# Patient Record
Sex: Female | Born: 1940 | Race: White | Hispanic: No | Marital: Married | Smoking: Never smoker
Health system: Southern US, Community
[De-identification: ages and names within clinical notes are randomized; demographics above are authoritative.]

## PROBLEM LIST (undated history)

## (undated) DIAGNOSIS — K219 Gastro-esophageal reflux disease without esophagitis: Secondary | ICD-10-CM

## (undated) DIAGNOSIS — E039 Hypothyroidism, unspecified: Secondary | ICD-10-CM

## (undated) DIAGNOSIS — M1990 Unspecified osteoarthritis, unspecified site: Secondary | ICD-10-CM

## (undated) DIAGNOSIS — I10 Essential (primary) hypertension: Secondary | ICD-10-CM

## (undated) HISTORY — PX: OTHER SURGICAL HISTORY: SHX169

## (undated) HISTORY — PX: ABDOMINAL HYSTERECTOMY: SHX81

---

## 2001-12-05 ENCOUNTER — Ambulatory Visit (HOSPITAL_COMMUNITY): Admission: RE | Admit: 2001-12-05 | Discharge: 2001-12-05 | Payer: Self-pay | Admitting: Gastroenterology

## 2001-12-05 ENCOUNTER — Encounter: Payer: Self-pay | Admitting: Gastroenterology

## 2003-06-18 ENCOUNTER — Ambulatory Visit (HOSPITAL_COMMUNITY): Admission: RE | Admit: 2003-06-18 | Discharge: 2003-06-18 | Payer: Self-pay | Admitting: Gastroenterology

## 2004-05-30 ENCOUNTER — Encounter: Admission: RE | Admit: 2004-05-30 | Discharge: 2004-05-30 | Payer: Self-pay | Admitting: Internal Medicine

## 2007-07-03 ENCOUNTER — Encounter: Admission: RE | Admit: 2007-07-03 | Discharge: 2007-07-03 | Payer: Self-pay | Admitting: Otolaryngology

## 2008-05-21 ENCOUNTER — Other Ambulatory Visit: Admission: RE | Admit: 2008-05-21 | Discharge: 2008-05-21 | Payer: Self-pay | Admitting: Family Medicine

## 2008-10-28 ENCOUNTER — Ambulatory Visit: Payer: Self-pay | Admitting: Vascular Surgery

## 2009-01-27 ENCOUNTER — Ambulatory Visit (HOSPITAL_COMMUNITY): Admission: RE | Admit: 2009-01-27 | Discharge: 2009-01-27 | Payer: Self-pay | Admitting: Ophthalmology

## 2009-05-05 ENCOUNTER — Ambulatory Visit (HOSPITAL_COMMUNITY): Admission: RE | Admit: 2009-05-05 | Discharge: 2009-05-05 | Payer: Self-pay | Admitting: Ophthalmology

## 2010-05-23 LAB — CBC
HCT: 42.8 % (ref 36.0–46.0)
Hemoglobin: 14.6 g/dL (ref 12.0–15.0)
MCHC: 34.1 g/dL (ref 30.0–36.0)
MCV: 96.9 fL (ref 78.0–100.0)
Platelets: 253 10*3/uL (ref 150–400)
RBC: 4.41 MIL/uL (ref 3.87–5.11)
RDW: 12.6 % (ref 11.5–15.5)
WBC: 6.9 10*3/uL (ref 4.0–10.5)

## 2010-05-23 LAB — BASIC METABOLIC PANEL
BUN: 13 mg/dL (ref 6–23)
CO2: 31 mEq/L (ref 19–32)
Calcium: 9.5 mg/dL (ref 8.4–10.5)
Chloride: 98 mEq/L (ref 96–112)
Creatinine, Ser: 0.85 mg/dL (ref 0.4–1.2)
GFR calc Af Amer: >60 mL/min (ref >60)
GFR calc non Af Amer: >60 mL/min (ref >60)
Glucose, Bld: 89 mg/dL (ref 70–99)
Potassium: 3.7 mEq/L (ref 3.5–5.1)
Sodium: 139 mEq/L (ref 135–145)

## 2010-05-31 LAB — CBC
HCT: 42.9 % (ref 36.0–46.0)
Hemoglobin: 14.5 g/dL (ref 12.0–15.0)
MCHC: 33.8 g/dL (ref 30.0–36.0)
MCV: 97.5 fL (ref 78.0–100.0)
Platelets: 284 10*3/uL (ref 150–400)
RBC: 4.4 MIL/uL (ref 3.87–5.11)
RDW: 13 % (ref 11.5–15.5)
WBC: 10.3 10*3/uL (ref 4.0–10.5)

## 2010-05-31 LAB — BASIC METABOLIC PANEL
BUN: 17 mg/dL (ref 6–23)
CO2: 33 mEq/L — ABNORMAL HIGH (ref 19–32)
Calcium: 9.7 mg/dL (ref 8.4–10.5)
Chloride: 100 mEq/L (ref 96–112)
Creatinine, Ser: 0.89 mg/dL (ref 0.4–1.2)
GFR calc Af Amer: >60 mL/min (ref >60)
GFR calc non Af Amer: >60 mL/min (ref >60)
Glucose, Bld: 93 mg/dL (ref 70–99)
Potassium: 3.6 mEq/L (ref 3.5–5.1)
Sodium: 141 mEq/L (ref 135–145)

## 2010-07-12 NOTE — Procedures (Signed)
CAROTID DUPLEX EXAM   INDICATION:  Right side loss of vision, possible carotid disease.   HISTORY:  Diabetes:  No  Cardiac:  No  Hypertension:  Yes  Smoking:  No  Previous Surgery:  No  CV History:  The patient states right eye vision loss for the last 6  weeks.  Amaurosis Fugax No, Paresthesias No, Hemiparesis No                                       RIGHT             LEFT  Brachial systolic pressure:         164               154  Brachial Doppler waveforms:         Normal            Normal  Vertebral direction of flow:        Antegrade         Antegrade  DUPLEX VELOCITIES (cm/sec)  CCA peak systolic                   72                78  ECA peak systolic                   85                64  ICA peak systolic                   54                60  ICA end diastolic                   20                17  PLAQUE MORPHOLOGY:                  Heterogeneous     Heterogenous  PLAQUE AMOUNT:                      Minimal           Minimal  PLAQUE LOCATION:                    ICA/ECA           ICA   IMPRESSION:  1. 1% to 39% stenosis of the bilateral internal carotid arteries.  2. A preliminary report was faxed to Dr. Jone Baseman office on October 28, 2008.       ___________________________________________  Larina Earthly, M.D.   CH/MEDQ  D:  10/29/2008  T:  10/29/2008  Job:  [id number]   cc:   Jillyn Hidden A. Rankin, M.D.  Gretta Arab Valentina Lucks, M.D.

## 2010-07-15 NOTE — Op Note (Signed)
NAME:  Cheryl Herman, Cheryl Herman                       ACCOUNT NO.:  [id number]   MEDICAL RECORD NO.:  [id number]                   PATIENT TYPE:  AMB   LOCATION:  ENDO                                 FACILITY:  [facility]   PHYSICIAN:  Danise Edge, M.D.                DATE OF BIRTH:  1940-12-31   DATE OF PROCEDURE:  06/18/2003  DATE OF DISCHARGE:                                 OPERATIVE REPORT   PROCEDURE:  Colonoscopy.   REFERRED BY:  L. Lupe Carney, M.D.   INDICATIONS FOR PROCEDURE:  Cheryl Herman is a 70 year old female born  [birth year].  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  To control her constipation, she takes p.r.n. Metamucil.  She has  generalized bloating with intermittent sharp left sided abdominal pain  unassociated with gastrointestinal bleeding.   On October 30, 2001, Cheryl Herman complete metabolic profile, thyroid  stimulating hormone level, lipid profile were normal.  Her hemoglobin was  10.9 g and her MCV 82.5.   ALLERGIES:  None.   PAST MEDICAL/SURGICAL HISTORY:  Goiter surgery, hysterectomy,  osteoarthritis, hypothyroidism.   FAMILY HISTORY:  Negative for colon cancer.   ENDOSCOPIST:  Danise Edge, M.D.   PREMEDICATION:  Versed 7.5 mg, Demerol 50 mg.   DESCRIPTION OF PROCEDURE:  After obtaining informed consent, Cheryl Herman  was placed in the left lateral  decubitus position. I administered intravenous Demerol and intravenous  Versed to achieve conscious sedation for the procedure. The patient's blood  pressure, oxygen saturation and cardiac rhythm were monitored throughout the  procedure and documented in the medical record.   Anal inspection and digital rectal exam were normal. The Olympus adjustable  pediatric colonoscope was introduced into the rectum and advanced to the  cecum. Colonic preparation for the exam today was excellent.   RECTUM:  Normal.   SIGMOID COLON AND DESCENDING COLON:  Left colonic  diverticulosis.   SPLENIC FLEXURE:  Normal.   TRANSVERSE COLON:  Normal.   HEPATIC FLEXURE:  Normal.   ASCENDING COLON:  Normal.   CECUM AND ILEOCECAL VALVE:  Normal.   ASSESSMENT:  Normal proctocolonoscopy to the cecum.  Diverticulosis of the  left colon is noted.                                               Danise Edge, M.D.    MJ/MEDQ  D:  06/18/2003  T:  06/19/2003  Job:  [id number]   cc:   L. Lupe Carney, M.D.  [address] [place]  [place] [address]  Fax: [phone]

## 2010-07-15 NOTE — Op Note (Signed)
   NAME:  Cheryl Herman, Cheryl Herman NO.:  [id number]   MEDICAL RECORD NO.:  [id number]                   PATIENT TYPE:  AMB   LOCATION:  ENDO                                 FACILITY:  [facility]   PHYSICIAN:  Danise Edge, M.D.                DATE OF BIRTH:  1941-01-23   DATE OF PROCEDURE:  DATE OF DISCHARGE:  12/05/2001                                 OPERATIVE REPORT   PROCEDURE:  Flexible proctosigmoidoscopy report.   PROCEDURE INDICATIONS:  The patient is a 70 year-old female born August 09, 1940.  She is scheduled to undergo a screening colonoscopy to evaluate left  lower quadrant abdominal pain.   ENDOSCOPIST:  Danise Edge, M.D.   PREMEDICATION:  Versed 5 mg and Fentanyl 50 mcg.   PROCEDURE:  After obtaining informed consent the patient was placed in the  left lateral decubitus position.  I administered intravenous Versed and  intravenous Fentanyl to achieve conscious sedation for the procedure.  The  patient's blood pressure, oxygen saturation and cardiac rhythm were  monitored throughout the procedure and documented in the medical record.   Anal inspection was normal.  Digital rectal exam was normal.  The Olympus  pediatric video colonoscope was introduced into the rectum and advanced only  to approximately 35 cm from the anal verge.  Due to colonic loop formation,  I was unable to complete a full colonoscopy.   Endoscopic appearance of the rectum and sigmoid colon with the endoscope  advanced to 35 cm from the anal verge reveals left colonic diverticulosis,  but no endoscopic evidence for the presence of colorectal neoplasia.   PLAN:  Air contrast barium enema to follow.                                               Danise Edge, M.D.    MJ/MEDQ  D:  01/15/2002  T:  01/15/2002  Job:  [id number]

## 2013-09-16 ENCOUNTER — Other Ambulatory Visit: Payer: Self-pay | Admitting: Gastroenterology

## 2013-10-10 ENCOUNTER — Encounter (HOSPITAL_COMMUNITY): Payer: Self-pay | Admitting: Pharmacy Technician

## 2013-10-13 ENCOUNTER — Encounter (HOSPITAL_COMMUNITY): Payer: Self-pay | Admitting: *Deleted

## 2013-10-13 ENCOUNTER — Encounter (HOSPITAL_COMMUNITY): Payer: Self-pay | Admitting: Pharmacy Technician

## 2013-10-27 ENCOUNTER — Encounter (HOSPITAL_COMMUNITY): Payer: Medicare Other | Admitting: Anesthesiology

## 2013-10-27 ENCOUNTER — Encounter (HOSPITAL_COMMUNITY)
Admission: RE | Disposition: A | Discharge status: Home / Self Care | Payer: Self-pay | Source: Ambulatory Visit | Attending: Gastroenterology

## 2013-10-27 ENCOUNTER — Encounter (HOSPITAL_COMMUNITY): Payer: Self-pay | Admitting: *Deleted

## 2013-10-27 ENCOUNTER — Ambulatory Visit (HOSPITAL_COMMUNITY): Payer: Medicare Other | Admitting: Anesthesiology

## 2013-10-27 ENCOUNTER — Ambulatory Visit (HOSPITAL_COMMUNITY)
Admission: RE | Admit: 2013-10-27 | Discharge: 2013-10-27 | Disposition: A | Discharge status: Home / Self Care | Payer: Medicare Other | Source: Ambulatory Visit | Attending: Gastroenterology | Admitting: Gastroenterology

## 2013-10-27 DIAGNOSIS — E039 Hypothyroidism, unspecified: Secondary | ICD-10-CM | POA: Insufficient documentation

## 2013-10-27 DIAGNOSIS — K5730 Diverticulosis of large intestine without perforation or abscess without bleeding: Secondary | ICD-10-CM | POA: Diagnosis not present

## 2013-10-27 DIAGNOSIS — I10 Essential (primary) hypertension: Secondary | ICD-10-CM | POA: Diagnosis not present

## 2013-10-27 DIAGNOSIS — K219 Gastro-esophageal reflux disease without esophagitis: Secondary | ICD-10-CM | POA: Diagnosis not present

## 2013-10-27 DIAGNOSIS — E7800 Pure hypercholesterolemia, unspecified: Secondary | ICD-10-CM | POA: Insufficient documentation

## 2013-10-27 DIAGNOSIS — K2990 Gastroduodenitis, unspecified, without bleeding: Secondary | ICD-10-CM | POA: Diagnosis not present

## 2013-10-27 DIAGNOSIS — Z1211 Encounter for screening for malignant neoplasm of colon: Secondary | ICD-10-CM | POA: Insufficient documentation

## 2013-10-27 DIAGNOSIS — R1310 Dysphagia, unspecified: Secondary | ICD-10-CM | POA: Diagnosis present

## 2013-10-27 DIAGNOSIS — K2970 Gastritis, unspecified, without bleeding: Secondary | ICD-10-CM | POA: Diagnosis not present

## 2013-10-27 DIAGNOSIS — D131 Benign neoplasm of stomach: Secondary | ICD-10-CM | POA: Diagnosis not present

## 2013-10-27 HISTORY — DX: Gastro-esophageal reflux disease without esophagitis: K21.9

## 2013-10-27 HISTORY — DX: Hypothyroidism, unspecified: E03.9

## 2013-10-27 HISTORY — DX: Essential (primary) hypertension: I10

## 2013-10-27 HISTORY — PX: ESOPHAGOGASTRODUODENOSCOPY (EGD) WITH PROPOFOL: SHX5813

## 2013-10-27 HISTORY — DX: Unspecified osteoarthritis, unspecified site: M19.90

## 2013-10-27 HISTORY — PX: COLONOSCOPY WITH PROPOFOL: SHX5780

## 2013-10-27 SURGERY — ESOPHAGOGASTRODUODENOSCOPY (EGD) WITH PROPOFOL
Anesthesia: Monitor Anesthesia Care

## 2013-10-27 MED ORDER — PROPOFOL INFUSION 10 MG/ML OPTIME
INTRAVENOUS | Status: DC | PRN
  Administered 2013-10-27: 90 ug/kg/min via INTRAVENOUS

## 2013-10-27 MED ORDER — LACTATED RINGERS IV SOLN
INTRAVENOUS | Status: DC
  Administered 2013-10-27: 1000 mL via INTRAVENOUS

## 2013-10-27 MED ORDER — PROPOFOL 10 MG/ML IV BOLUS
INTRAVENOUS | Status: AC
  Filled 2013-10-27: qty 20

## 2013-10-27 MED ORDER — SODIUM CHLORIDE 0.9 % IV SOLN: INTRAVENOUS | Status: DC

## 2013-10-27 SURGICAL SUPPLY — 25 items

## 2013-10-27 NOTE — Op Note (Signed)
Problems: Esophageal dysphagia. 06/27/2007 barium esophagram showed C5-C6 osteophytes indenting the esophagus associated with tertiary esophageal contractions. October 2003, normal flexible proctosigmoidoscopy followed by air contrast barium enema. 06/18/2003, normal screening colonoscopy. Treated H. pyloric gastritis approximately 2 months ago  Endoscopist: Earle Gell  Premedication: Propofol administered by anesthesia  Procedure: Diagnostic esophagogastroduodenoscopy with gastric biopsies to look for H. pylori gastritis The patient was placed in the left lateral decubitus position. The Pentax gastroscope was passed through the posterior hypopharynx into the proximal esophagus without difficulty. The hypopharynx, larynx, and vocal cords appeared normal.  Esophagoscopy: The proximal, mid, and lower segments of the esophageal mucosa appeared normal. The squamocolumnar junction was regular in appearance and noted at 40 cm from the incisor teeth  Gastroscopy: Retroflex view of the gastric cardia and fundus was normal. The gastric body, antrum, and pylorus appeared normal. Biopsies were performed from the gastric antrum, gastric cardia, and gastric body to look for H. pylori gastritis  Duodenoscopy: The duodenal bulb and descending duodenum appeared normal.  Assessment: Normal esophagogastroduodenoscopy. Gastric biopsies to look for H. pylori gastritis pending  Procedure: Repeat screening colonoscopy Anal inspection and digital rectal exam were normal. The Pentax pediatric colonoscope was introduced into the rectum and advanced to the cecum. A normal-appearing appendiceal orifice was identified. A normal-appearing ileocecal valve was identified. Colonic preparation for the exam today was good.  The patient has universal colonic diverticulosis  Rectum. Normal. Retroflexed view of the distal rectum normal  Sigmoid colon and descending colon. Normal  Splenic flexure. Normal  Transverse colon.  Normal  Hepatic flexure. Normal  Ascending colon. Normal  Cecum and ileocecal valve. Normal  Assessment: Normal screening colonoscopy  Recommendation: I do not recommend scheduling a repeat screening colonoscopy.

## 2013-10-27 NOTE — Discharge Instructions (Addendum)

## 2013-10-27 NOTE — Transfer of Care (Signed)
Immediate Anesthesia Transfer of Care Note  Patient: Cheryl Herman  Procedure(s) Performed: Procedure(s): ESOPHAGOGASTRODUODENOSCOPY (EGD) WITH PROPOFOL (N/A) COLONOSCOPY WITH PROPOFOL (N/A)  Patient Location: PACU  Anesthesia Type:MAC  Level of Consciousness: sedated  Airway & Oxygen Therapy: Patient Spontanous Breathing and Patient connected to nasal cannula oxygen  Post-op Assessment: Report given to PACU RN and Post -op Vital signs reviewed and stable  Post vital signs: Reviewed and stable  Complications: No apparent anesthesia complications

## 2013-10-27 NOTE — H&P (Signed)
  Procedure: Screening colonoscopy. Diagnostic esophagogastroduodenoscopy with possible esophageal dilation to evaluate esophageal dysphagia.  History: The patient is a 73 year old female born 1941-01-20. In October 2003, the patient underwent a normal flexible proctosigmoidoscopy followed by air contrast barium enema. Left colonic diverticulosis was diagnosed.  On 06/18/2003, the patient underwent a normal screening colonoscopy.  On 06/27/2007, the patient was evaluated by Dr. Jodi Marble when she developed oropharyngeal transfer dysphagia. Barium esophagram with barium tablet swallow showed C5-C6 osteophytes indenting the esophagus associated with tertiary esophageal contractions. The barium tablet traversed the esophagus and entered the stomach without obstruction.  Last month, the patient's H. pyloric serology was positive and she was treated with omeprazole plus amoxicillin plus Biaxin.  The patient continues to experience intermittent esophageal dysphagia.  She is scheduled to undergo screening colonoscopy and diagnostic esophagogastroduodenoscopy.  Medication allergies: Simvastatin  Past medical history: Hypertension. Hypothyroidism. Gastroesophageal reflux. Hypercholesterolemia. Hysterectomy. Partial thyroidectomy. Rotator cuff surgery. Retinal tear.  Exam: The patient is alert and lying comfortably on the endoscopy stretcher. Abdomen is soft and nontender to palpation. Lungs are clear to auscultation. Cardiac exam reveals a regular rhythm.  Plan: Proceed with diagnostic esophagogastroduodenoscopy with possible esophageal dilation and repeat screening colonoscopy

## 2013-10-27 NOTE — Anesthesia Postprocedure Evaluation (Signed)
  Anesthesia Post-op Note  Patient: Cheryl Herman  Procedure(s) Performed: Procedure(s): ESOPHAGOGASTRODUODENOSCOPY (EGD) WITH PROPOFOL (N/A) COLONOSCOPY WITH PROPOFOL (N/A)  Patient Location: PACU  Anesthesia Type:MAC  Level of Consciousness: awake, alert  and oriented  Airway and Oxygen Therapy: Patient Spontanous Breathing  Post-op Pain: none  Post-op Assessment: Post-op Vital signs reviewed  Post-op Vital Signs: Reviewed  Last Vitals:  Filed Vitals:   10/27/13 1300  BP: 120/58  Pulse: 66  Temp:   Resp: 14    Complications: No apparent anesthesia complications

## 2013-10-27 NOTE — Anesthesia Preprocedure Evaluation (Addendum)
Anesthesia Evaluation  Patient identified by MRN, date of birth, ID band Patient awake    Reviewed: Allergy & Precautions, H&P , NPO status , Patient's Chart, lab work & pertinent test results  Airway Mallampati: II TM Distance: >3 FB Neck ROM: Full    Dental  (+) Teeth Intact, Dental Advisory Given   Pulmonary neg pulmonary ROS,  breath sounds clear to auscultation        Cardiovascular hypertension, Rhythm:Regular Rate:Normal     Neuro/Psych negative neurological ROS  negative psych ROS   GI/Hepatic Neg liver ROS, GERD-  ,  Endo/Other  Hypothyroidism   Renal/GU negative Renal ROS  negative genitourinary   Musculoskeletal negative musculoskeletal ROS (+)   Abdominal   Peds  Hematology negative hematology ROS (+)   Anesthesia Other Findings   Reproductive/Obstetrics negative OB ROS                          Anesthesia Physical Anesthesia Plan  ASA: II  Anesthesia Plan: MAC   Post-op Pain Management:    Induction: Intravenous  Airway Management Planned: Simple Face Mask  Additional Equipment: None  Intra-op Plan:   Post-operative Plan:   Informed Consent: I have reviewed the patients History and Physical, chart, labs and discussed the procedure including the risks, benefits and alternatives for the proposed anesthesia with the patient or authorized representative who has indicated his/her understanding and acceptance.   Dental advisory given  Plan Discussed with: CRNA  Anesthesia Plan Comments:         Anesthesia Quick Evaluation

## 2013-10-28 ENCOUNTER — Encounter (HOSPITAL_COMMUNITY): Payer: Self-pay | Admitting: Gastroenterology

## 2015-08-02 ENCOUNTER — Other Ambulatory Visit: Payer: Self-pay | Admitting: Family Medicine

## 2015-08-02 DIAGNOSIS — R1032 Left lower quadrant pain: Secondary | ICD-10-CM

## 2015-08-03 ENCOUNTER — Ambulatory Visit
Admission: RE | Admit: 2015-08-03 | Discharge: 2015-08-03 | Disposition: A | Discharge status: Home / Self Care | Payer: Self-pay | Source: Ambulatory Visit | Attending: Family Medicine | Admitting: Family Medicine

## 2015-08-03 DIAGNOSIS — R1032 Left lower quadrant pain: Secondary | ICD-10-CM

## 2015-08-03 MED ORDER — IOPAMIDOL (ISOVUE-300) INJECTION 61%
100.0000 mL | Freq: Once | INTRAVENOUS | Status: AC | PRN
  Administered 2015-08-03: 100 mL via INTRAVENOUS

## 2015-08-24 ENCOUNTER — Ambulatory Visit (HOSPITAL_COMMUNITY): Payer: Medicare Other

## 2015-08-24 ENCOUNTER — Other Ambulatory Visit (HOSPITAL_COMMUNITY): Payer: Self-pay | Admitting: Urology

## 2015-08-24 ENCOUNTER — Ambulatory Visit (HOSPITAL_COMMUNITY)
Admission: RE | Admit: 2015-08-24 | Discharge: 2015-08-24 | Disposition: A | Discharge status: Home / Self Care | Payer: Medicare Other | Source: Ambulatory Visit | Attending: Urology | Admitting: Urology

## 2015-08-24 DIAGNOSIS — D49511 Neoplasm of unspecified behavior of right kidney: Secondary | ICD-10-CM

## 2015-08-24 DIAGNOSIS — N2889 Other specified disorders of kidney and ureter: Secondary | ICD-10-CM

## 2015-09-03 ENCOUNTER — Other Ambulatory Visit: Payer: Self-pay | Admitting: Radiology

## 2015-09-06 ENCOUNTER — Encounter (HOSPITAL_COMMUNITY): Payer: Self-pay

## 2015-09-06 ENCOUNTER — Ambulatory Visit (HOSPITAL_COMMUNITY)
Admission: RE | Admit: 2015-09-06 | Discharge: 2015-09-06 | Disposition: A | Discharge status: Home / Self Care | Payer: Medicare Other | Source: Ambulatory Visit | Attending: Urology | Admitting: Urology

## 2015-09-06 ENCOUNTER — Ambulatory Visit (HOSPITAL_COMMUNITY)
Admission: RE | Admit: 2015-09-06 | Discharge: 2015-09-06 | Disposition: A | Discharge status: Home / Self Care | Payer: Medicare Other | Source: Ambulatory Visit | Attending: Interventional Radiology | Admitting: Interventional Radiology

## 2015-09-06 DIAGNOSIS — I10 Essential (primary) hypertension: Secondary | ICD-10-CM | POA: Diagnosis not present

## 2015-09-06 DIAGNOSIS — N2889 Other specified disorders of kidney and ureter: Secondary | ICD-10-CM | POA: Diagnosis present

## 2015-09-06 DIAGNOSIS — K219 Gastro-esophageal reflux disease without esophagitis: Secondary | ICD-10-CM | POA: Diagnosis not present

## 2015-09-06 DIAGNOSIS — M1990 Unspecified osteoarthritis, unspecified site: Secondary | ICD-10-CM | POA: Diagnosis not present

## 2015-09-06 DIAGNOSIS — D3001 Benign neoplasm of right kidney: Secondary | ICD-10-CM | POA: Diagnosis not present

## 2015-09-06 DIAGNOSIS — Z7982 Long term (current) use of aspirin: Secondary | ICD-10-CM | POA: Diagnosis not present

## 2015-09-06 DIAGNOSIS — Z9889 Other specified postprocedural states: Secondary | ICD-10-CM

## 2015-09-06 DIAGNOSIS — E039 Hypothyroidism, unspecified: Secondary | ICD-10-CM | POA: Insufficient documentation

## 2015-09-06 DIAGNOSIS — Z79899 Other long term (current) drug therapy: Secondary | ICD-10-CM | POA: Insufficient documentation

## 2015-09-06 LAB — CBC
HCT: 42.1 % (ref 36.0–46.0)
Hemoglobin: 13.5 g/dL (ref 12.0–15.0)
MCH: 31.7 pg (ref 26.0–34.0)
MCHC: 32.1 g/dL (ref 30.0–36.0)
MCV: 98.8 fL (ref 78.0–100.0)
Platelets: 281 10*3/uL (ref 150–400)
RBC: 4.26 MIL/uL (ref 3.87–5.11)
RDW: 12.8 % (ref 11.5–15.5)
WBC: 7.2 10*3/uL (ref 4.0–10.5)

## 2015-09-06 LAB — PROTIME-INR
INR: 0.91 (ref 0.00–1.49)
Prothrombin Time: 12.5 seconds (ref 11.6–15.2)

## 2015-09-06 LAB — APTT: aPTT: 28 seconds (ref 24–37)

## 2015-09-06 MED ORDER — SODIUM CHLORIDE 0.9 % IV SOLN: INTRAVENOUS | Status: DC

## 2015-09-06 MED ORDER — FENTANYL CITRATE (PF) 100 MCG/2ML IJ SOLN
INTRAMUSCULAR | Status: AC
  Filled 2015-09-06: qty 2

## 2015-09-06 MED ORDER — SODIUM CHLORIDE 0.9 % IV SOLN
INTRAVENOUS | Status: AC | PRN
  Administered 2015-09-06: 10 mL/h via INTRAVENOUS

## 2015-09-06 MED ORDER — FENTANYL CITRATE (PF) 100 MCG/2ML IJ SOLN
INTRAMUSCULAR | Status: AC | PRN
  Administered 2015-09-06: 50 ug via INTRAVENOUS

## 2015-09-06 MED ORDER — LIDOCAINE HCL 1 % IJ SOLN
INTRAMUSCULAR | Status: AC
  Filled 2015-09-06: qty 20

## 2015-09-06 MED ORDER — ACETAMINOPHEN 325 MG PO TABS
650.0000 mg | ORAL_TABLET | Freq: Once | ORAL | Status: AC
  Administered 2015-09-06: 650 mg via ORAL

## 2015-09-06 MED ORDER — ACETAMINOPHEN 325 MG PO TABS
ORAL_TABLET | ORAL | Status: AC
  Filled 2015-09-06: qty 2

## 2015-09-06 MED ORDER — MIDAZOLAM HCL 2 MG/2ML IJ SOLN
INTRAMUSCULAR | Status: AC
  Filled 2015-09-06: qty 2

## 2015-09-06 MED ORDER — MIDAZOLAM HCL 2 MG/2ML IJ SOLN
INTRAMUSCULAR | Status: AC | PRN
  Administered 2015-09-06: 1 mg via INTRAVENOUS

## 2015-09-06 NOTE — Discharge Instructions (Signed)
Kidney Biopsy, Care After °Refer to this sheet in the next few weeks. These instructions provide you with information on caring for yourself after your procedure. Your health care provider may also give you more specific instructions. Your treatment has been planned according to current medical practices, but problems sometimes occur. Call your health care provider if you have any problems or questions after your procedure.  °WHAT TO EXPECT AFTER THE PROCEDURE  °· You may notice blood in the urine for the first 24 hours after the biopsy. °· You may feel some pain at the biopsy site for 1-2 weeks after the biopsy. °HOME CARE INSTRUCTIONS °· Do not lift anything heavier than 10 lb (4.5 kg) for 2 weeks. °· Do not take any non-steroidal anti-inflammatory drugs (NSAIDs) or any blood thinners for a week after the biopsy unless instructed to do so by your health care provider. °· Only take medicines for pain, fever, or discomfort as directed by your health care provider. °SEEK MEDICAL CARE IF: °· You have bloody urine more than 24 hours after the biopsy.   °· You develop a fever.   °· You cannot urinate.   °· You have increasing pain at the biopsy site.   °SEEK IMMEDIATE MEDICAL CARE IF: °You feel faint or dizzy.  °  °This information is not intended to replace advice given to you by your health care provider. Make sure you discuss any questions you have with your health care provider. °  °Document Released: 10/16/2012 Document Reviewed: 10/16/2012 °Elsevier Interactive Patient Education ©2016 [employer] ° °

## 2015-09-06 NOTE — Procedures (Signed)
Interventional Radiology Procedure Note  Procedure:  CT guided biopsy of right renal mass  Complications:  Potential tiny posterior PTX  Estimated Blood Loss:  None  Findings:  CT guided core biopsy of right renal mass; 18 G core bx x 2 via 17 G needle. Potential tiny PTX at posterior lung base on post bx CT.  Will check w/ CXR in 2 hours. 3 hours bedrest during recovery.  Venetia Night. Kathlene Cote, M.D Pager:  [phone]

## 2015-09-06 NOTE — H&P (Signed)
Chief Complaint: Patient was seen in consultation today for right renal mass biopsy at the request of Odin  Referring Physician(s): Moffett  Supervising Physician: Aletta Edouard  Patient Status: Outpatient  History of Present Illness: Cheryl Herman is a 75 y.o. female   Pt had colonoscopy over 1 yr ago Developed Left abdominal pain approx 2 months later Was seen by PMD; work up negative and pain did seem to subside to some degree Never fully resolved and continued to complain CT 08/03/15: IMPRESSION: 1. No acute process in the abdomen or pelvis. No explanation for left lower quadrant pain, constipation, or diarrhea. There is extensive diverticulosis without evidence of diverticulitis. 2. 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 Recommend urology consultation. No evidence of right renal vein involvement or metastatic disease in the abdomen/pelvis. 3. Atherosclerosis, including within the coronary arteries. 4. Right hepatic hemangioma.  Referred to Dr Dutch Gray  Now scheduled for biopsy of same  Past Medical History  Diagnosis Date  . Hypertension   . Hypothyroidism   . GERD (gastroesophageal reflux disease)   . Arthritis     oa    Past Surgical History  Procedure Laterality Date  . Partial thryoidectomy  yrs ago  . Abdominal hysterectomy      partial  . Esophagogastroduodenoscopy (egd) with propofol N/A 10/27/2013    Procedure: ESOPHAGOGASTRODUODENOSCOPY (EGD) WITH PROPOFOL;  Surgeon: Garlan Fair, MD;  Location: [facility];  Service: Endoscopy;  Laterality: N/A;  . Colonoscopy with propofol N/A 10/27/2013    Procedure: COLONOSCOPY WITH PROPOFOL;  Surgeon: Garlan Fair, MD;  Location: [facility];  Service: Endoscopy;  Laterality: N/A;    Allergies: Review of patient's allergies indicates no known allergies.  Medications: Prior to Admission medications   Medication Sig Start Date End Date Taking?  Authorizing Provider  aspirin EC 81 MG tablet Take 81 mg by mouth every evening.   Yes Historical Provider, MD  atenolol-chlorthalidone (TENORETIC) 50-25 MG per tablet Take 1 tablet by mouth every morning.   Yes Historical Provider, MD  atorvastatin (LIPITOR) 10 MG tablet Take 10 mg by mouth every other day.   Yes Historical Provider, MD  B Complex-C (B-COMPLEX WITH VITAMIN C) tablet Take 1 tablet by mouth daily.   Yes Historical Provider, MD  cholecalciferol (VITAMIN D) 1000 UNITS tablet Take 1,000 Units by mouth daily.   Yes Historical Provider, MD  ketorolac (ACULAR) 0.5 % ophthalmic solution Place 1 drop into the right eye every other day.   Yes Historical Provider, MD  levothyroxine (SYNTHROID, LEVOTHROID) 88 MCG tablet Take 88 mcg by mouth daily before breakfast.   Yes Historical Provider, MD  Misc Natural Products (OSTEO BI-FLEX JOINT SHIELD PO) Take 2 tablets by mouth daily.   Yes Historical Provider, MD  Multiple Vitamin (MULTIVITAMIN WITH MINERALS) TABS tablet Take 1 tablet by mouth daily.   Yes Historical Provider, MD  omeprazole (PRILOSEC) 40 MG capsule Take 40 mg by mouth daily.   Yes Historical Provider, MD  psyllium (REGULOID) 0.52 G capsule Take 1.04 g by mouth daily.   Yes Historical Provider, MD     History reviewed. No pertinent family history.  Social History   Social History  . Marital Status: Married    Spouse Name: N/A  . Number of Children: N/A  . Years of Education: N/A   Social History Main Topics  . Smoking status: Never Smoker   . Smokeless tobacco: Never Used  . Alcohol Use: No  .  Drug Use: No  . Sexual Activity: Not Asked   Other Topics Concern  . None   Social History Narrative     Review of Systems: A 12 point ROS discussed and pertinent positives are indicated in the HPI above.  All other systems are negative.  Review of Systems  Constitutional: Positive for unexpected weight change. Negative for fever, diaphoresis, activity change, appetite  change and fatigue.  Respiratory: Negative for shortness of breath.   Gastrointestinal: Positive for abdominal pain and constipation. Negative for blood in stool and anal bleeding.  Genitourinary: Positive for flank pain. Negative for hematuria.  Neurological: Negative for weakness.  Psychiatric/Behavioral: Negative for behavioral problems and confusion.    Vital Signs: BP 152/76 mmHg  Temp(Src) 97.7 F (36.5 C) (Oral)  Resp 18  Ht 5\' 5"  (1.651 m)  Wt 160 lb (72.576 kg)  BMI 26.63 kg/m2  SpO2 96%  Physical Exam  Constitutional: She is oriented to person, place, and time.  Cardiovascular: Normal rate, regular rhythm and normal heart sounds.   Pulmonary/Chest: Effort normal and breath sounds normal. She has no wheezes.  Abdominal: Soft. Bowel sounds are normal. There is no tenderness.  Musculoskeletal: Normal range of motion.  Neurological: She is alert and oriented to person, place, and time.  Skin: Skin is warm and dry.  Psychiatric: She has a normal mood and affect. Her behavior is normal. Judgment and thought content normal.  Nursing note and vitals reviewed.   Mallampati Score:  MD Evaluation Airway: WNL Heart: WNL Abdomen: WNL Chest/ Lungs: WNL ASA  Classification: 2 Mallampati/Airway Score: One  Imaging: Dg Chest 2 View  08/24/2015  CLINICAL DATA:  Right renal neoplasm. EXAM: CHEST  2 VIEW COMPARISON:  05/05/2009. FINDINGS: Mediastinum and hilar structures normal. Lungs are clear. Heart size normal. No pleural effusion or pneumothorax. No acute bony abnormality . IMPRESSION: No acute abnormality. Electronically Signed   By: Marcello Moores  Register   On: 08/24/2015 10:12    Labs:  CBC:  Recent Labs  09/06/15 0620  WBC 7.2  HGB 13.5  HCT 42.1  PLT 281    COAGS:  Recent Labs  09/06/15 0620  INR 0.91  APTT 28    BMP: No results for input(s): NA, K, CL, CO2, GLUCOSE, BUN, CALCIUM, CREATININE, GFRNONAA, GFRAA in the last 8760 hours.  Invalid input(s):  CMP  LIVER FUNCTION TESTS: No results for input(s): BILITOT, AST, ALT, ALKPHOS, PROT, ALBUMIN in the last 8760 hours.  TUMOR MARKERS: No results for input(s): AFPTM, CEA, CA199, CHROMGRNA in the last 8760 hours.  Assessment and Plan:  Right renal mass Scheduled for bx per Dr Alinda Money Risks and Benefits discussed with the patient including, but not limited to bleeding, infection, damage to adjacent structures or low yield requiring additional tests. All of the patient's questions were answered, patient is agreeable to proceed. Consent signed and in chart.   Thank you for this interesting consult.  I greatly enjoyed meeting AARIANNA NALLEY and look forward to participating in their care.  A copy of this report was sent to the requesting provider on this date.  Electronically Signed: Bryttani Blew A 09/06/2015, 7:08 AM   I spent a total of  30 Minutes   in face to face in clinical consultation, greater than 50% of which was counseling/coordinating care for right renal mass bx

## 2016-09-29 ENCOUNTER — Other Ambulatory Visit (HOSPITAL_COMMUNITY): Payer: Self-pay | Admitting: Urology

## 2016-09-29 ENCOUNTER — Ambulatory Visit (HOSPITAL_COMMUNITY)
Admission: RE | Admit: 2016-09-29 | Discharge: 2016-09-29 | Disposition: A | Discharge status: Home / Self Care | Payer: Medicare Other | Source: Ambulatory Visit | Attending: Urology | Admitting: Urology

## 2016-09-29 DIAGNOSIS — D49511 Neoplasm of unspecified behavior of right kidney: Secondary | ICD-10-CM

## 2017-08-23 ENCOUNTER — Other Ambulatory Visit: Payer: Self-pay | Admitting: Family Medicine

## 2017-08-23 DIAGNOSIS — M79602 Pain in left arm: Secondary | ICD-10-CM

## 2017-09-04 ENCOUNTER — Ambulatory Visit
Admission: RE | Admit: 2017-09-04 | Discharge: 2017-09-04 | Disposition: A | Discharge status: Home / Self Care | Payer: Medicare Other | Source: Ambulatory Visit | Attending: Family Medicine | Admitting: Family Medicine

## 2017-09-04 DIAGNOSIS — M79602 Pain in left arm: Secondary | ICD-10-CM

## 2017-09-04 MED ORDER — GADOBENATE DIMEGLUMINE 529 MG/ML IV SOLN
15.0000 mL | Freq: Once | INTRAVENOUS | Status: AC | PRN
  Administered 2017-09-04: 15 mL via INTRAVENOUS

## 2018-07-31 ENCOUNTER — Other Ambulatory Visit: Payer: Self-pay | Admitting: Family Medicine

## 2018-07-31 DIAGNOSIS — N2889 Other specified disorders of kidney and ureter: Secondary | ICD-10-CM

## 2018-07-31 DIAGNOSIS — R319 Hematuria, unspecified: Secondary | ICD-10-CM

## 2018-08-01 ENCOUNTER — Other Ambulatory Visit: Payer: Medicare Other

## 2018-08-13 ENCOUNTER — Ambulatory Visit
Admission: RE | Admit: 2018-08-13 | Discharge: 2018-08-13 | Disposition: A | Discharge status: Home / Self Care | Payer: Medicare Other | Source: Ambulatory Visit | Attending: Family Medicine | Admitting: Family Medicine

## 2018-08-13 ENCOUNTER — Other Ambulatory Visit: Payer: Self-pay

## 2018-08-13 DIAGNOSIS — N2889 Other specified disorders of kidney and ureter: Secondary | ICD-10-CM

## 2018-08-13 DIAGNOSIS — R319 Hematuria, unspecified: Secondary | ICD-10-CM

## 2018-08-16 ENCOUNTER — Ambulatory Visit
Admission: RE | Admit: 2018-08-16 | Discharge: 2018-08-16 | Disposition: A | Discharge status: Home / Self Care | Payer: Medicare Other | Source: Ambulatory Visit | Attending: Family Medicine | Admitting: Family Medicine

## 2018-08-16 ENCOUNTER — Other Ambulatory Visit: Payer: Self-pay | Admitting: Family Medicine

## 2018-08-16 DIAGNOSIS — N2889 Other specified disorders of kidney and ureter: Secondary | ICD-10-CM

## 2018-08-16 DIAGNOSIS — M549 Dorsalgia, unspecified: Secondary | ICD-10-CM

## 2018-09-11 ENCOUNTER — Other Ambulatory Visit: Payer: Self-pay | Admitting: Family Medicine

## 2018-09-11 DIAGNOSIS — M549 Dorsalgia, unspecified: Secondary | ICD-10-CM

## 2018-09-14 ENCOUNTER — Other Ambulatory Visit: Payer: Self-pay

## 2018-09-14 ENCOUNTER — Ambulatory Visit
Admission: RE | Admit: 2018-09-14 | Discharge: 2018-09-14 | Disposition: A | Discharge status: Home / Self Care | Payer: Medicare Other | Source: Ambulatory Visit | Attending: Family Medicine | Admitting: Family Medicine

## 2018-09-14 DIAGNOSIS — M549 Dorsalgia, unspecified: Secondary | ICD-10-CM

## 2018-12-05 ENCOUNTER — Other Ambulatory Visit: Payer: Self-pay

## 2019-07-15 ENCOUNTER — Encounter (INDEPENDENT_AMBULATORY_CARE_PROVIDER_SITE_OTHER): Payer: Medicare Other | Admitting: Ophthalmology

## 2019-07-17 ENCOUNTER — Encounter (INDEPENDENT_AMBULATORY_CARE_PROVIDER_SITE_OTHER): Payer: Self-pay | Admitting: Ophthalmology

## 2019-07-17 ENCOUNTER — Other Ambulatory Visit: Payer: Self-pay

## 2019-07-17 ENCOUNTER — Ambulatory Visit (INDEPENDENT_AMBULATORY_CARE_PROVIDER_SITE_OTHER): Payer: Medicare Other | Admitting: Ophthalmology

## 2019-07-17 DIAGNOSIS — H3321 Serous retinal detachment, right eye: Secondary | ICD-10-CM | POA: Insufficient documentation

## 2019-07-17 DIAGNOSIS — H35071 Retinal telangiectasis, right eye: Secondary | ICD-10-CM | POA: Insufficient documentation

## 2019-07-17 DIAGNOSIS — Z9889 Other specified postprocedural states: Secondary | ICD-10-CM

## 2019-07-17 DIAGNOSIS — G4733 Obstructive sleep apnea (adult) (pediatric): Secondary | ICD-10-CM

## 2019-07-17 DIAGNOSIS — H35351 Cystoid macular degeneration, right eye: Secondary | ICD-10-CM | POA: Diagnosis not present

## 2019-07-17 NOTE — Assessment & Plan Note (Signed)
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  Patient continues on CPAP and enjoys its use and its results.  Now the macular edema is nearly completely resolved in the right eye.

## 2019-07-17 NOTE — Assessment & Plan Note (Signed)
Macular telangiectasis (MAC-TEL), or parafoveal telangiectasis is a condition of "unknown" cause.  Findings in or near the macula (center of vision) consist of microaneurysms (leaking small capillaries), often with leakage of fluid which in the active phase can impact fine discriminatory vision, and in some cases trigger profound scarring in the macula, with severe permanent vision loss.  Standard treatment is observation and periodic examinations to monitor for treatable complications.   The cause  of this condition is "unknown".  However, the practice of Dr. Zadie Rhine has discovered an association with sleep apnea with its nightly periods of low oxygen in the blood stream (hypoxia), retained carbon dioxide (hypercapnia), associated with transient nocturnal hypertensive episodes.   More recently, some patients also been found to have advanced lung disease, whether asthma or COPD, with similar findings.  Dr. Zadie Rhine has been evaluating the association of sleep apnea, nightly hypoxia, and Macular telangiectasis for over 18 years.  Most patients are found to be noncompliant with sleep apnea therapy or testing in the past.  Resumption of CPAP or similar therapy is strongly recommended if ordered in the past.  Upon review of risk factors or findings positive for sleep apnea, more formal, extensive sleep laboratory or home testing, may be recommended.  Numerous patients, proven to have MAC-TEL, have improved or resolved their ey condition promptly, within weeks, of the use of nighttime oxygen supplementation or continuous positive airway pressure (CPAP).  OD, patient now really does enjoy the use and the results of use of CPAP after we instituted and urged her to stop this therapy.  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

## 2019-07-17 NOTE — Progress Notes (Signed)
07/17/2019     CHIEF COMPLAINT Patient presents for Retina Follow Up   HISTORY OF PRESENT ILLNESS: Cheryl Herman is a 79 y.o. female who presents to the clinic today for:   HPI    Retina Follow Up    Patient presents with  Other.  In both eyes.  Duration of 6 months.  Since onset it is stable.          Comments    6 month follow up - OCT OU Patient denies change in vision and overall has no complaints. Pt states at times she has a hard time reading but when she puts AT's in, it helps.         Last edited by [name] on 07/17/2019  9:48 AM. (History)      Referring physician: Kelton Pillar, MD [address] [place],  [place] [address]  HISTORICAL INFORMATION:   Selected notes from the MEDICAL RECORD NUMBER       CURRENT MEDICATIONS: Current Outpatient Medications (Ophthalmic Drugs)  Medication Sig  . ketorolac (ACULAR) 0.5 % ophthalmic solution Place 1 drop into the right eye every other day.   No current facility-administered medications for this visit. (Ophthalmic Drugs)   Current Outpatient Medications (Other)  Medication Sig  . aspirin EC 81 MG tablet Take 81 mg by mouth every evening.  Marland Kitchen atenolol-chlorthalidone (TENORETIC) 50-25 MG per tablet Take 1 tablet by mouth every morning.  Marland Kitchen atorvastatin (LIPITOR) 10 MG tablet Take 10 mg by mouth every other day.  . B Complex-C (B-COMPLEX WITH VITAMIN C) tablet Take 1 tablet by mouth daily.  . cholecalciferol (VITAMIN D) 1000 UNITS tablet Take 1,000 Units by mouth daily.  . hydrochlorothiazide (HYDRODIURIL) 12.5 MG tablet Take 12.5 mg by mouth every morning.  Marland Kitchen levothyroxine (SYNTHROID) 75 MCG tablet Take 75 mcg by mouth daily.  Marland Kitchen levothyroxine (SYNTHROID, LEVOTHROID) 88 MCG tablet Take 88 mcg by mouth daily before breakfast.  . metoprolol succinate (TOPROL-XL) 50 MG 24 hr tablet TAKE AS DIRECTED ONCE DAILY 90 DAYS  . Misc Natural Products (OSTEO BI-FLEX JOINT SHIELD PO) Take 2 tablets by  mouth daily.  . Multiple Vitamin (MULTIVITAMIN WITH MINERALS) TABS tablet Take 1 tablet by mouth daily.  Marland Kitchen omeprazole (PRILOSEC) 40 MG capsule Take 40 mg by mouth daily.  . psyllium (REGULOID) 0.52 G capsule Take 1.04 g by mouth daily.  . Vitamins/Minerals TABS Take by mouth.   No current facility-administered medications for this visit. (Other)      REVIEW OF SYSTEMS:    ALLERGIES No Known Allergies  PAST MEDICAL HISTORY Past Medical History:  Diagnosis Date  . Arthritis    oa  . GERD (gastroesophageal reflux disease)   . Hypertension   . Hypothyroidism    Past Surgical History:  Procedure Laterality Date  . ABDOMINAL HYSTERECTOMY     partial  . COLONOSCOPY WITH PROPOFOL N/A 10/27/2013   Procedure: COLONOSCOPY WITH PROPOFOL;  Surgeon: Garlan Fair, MD;  Location: [facility];  Service: Endoscopy;  Laterality: N/A;  . ESOPHAGOGASTRODUODENOSCOPY (EGD) WITH PROPOFOL N/A 10/27/2013   Procedure: ESOPHAGOGASTRODUODENOSCOPY (EGD) WITH PROPOFOL;  Surgeon: Garlan Fair, MD;  Location: [facility];  Service: Endoscopy;  Laterality: N/A;  . partial thryoidectomy  yrs ago    FAMILY HISTORY History reviewed. No pertinent family history.  SOCIAL HISTORY Social History   Tobacco Use  . Smoking status: Never Smoker  . Smokeless tobacco: Never Used  Substance Use Topics  . Alcohol  use: No  . Drug use: No         OPHTHALMIC EXAM: Base Eye Exam    Visual Acuity (Snellen - Linear)      Right Left   Dist cc 20/100-2 20/20-2   Dist ph cc 20/100+2        Tonometry (Tonopen, 9:54 AM)      Right Left   Pressure 15 14       Pupils      Pupils Dark Light Shape React APD   Right PERRL 4 4 Irregular Slow +1   Left PERRL 4 3 Round Slow None       Visual Fields (Counting fingers)      Left Right    Full    Restrictions  Partial outer superior temporal, superior nasal deficiencies       Extraocular Movement      Right Left    Full Full       Dilation     Both eyes: 1.0% Mydriacyl, 2.5% Phenylephrine @ 9:54 AM        Slit Lamp and Fundus Exam    External Exam      Right Left   External Normal Normal       Slit Lamp Exam      Right Left   Lids/Lashes Normal Normal   Conjunctiva/Sclera White and quiet White and quiet   Cornea Clear Clear   Anterior Chamber Deep and quiet Deep and quiet   Iris Round and reactive Round and reactive   Lens Posterior chamber intraocular lens Posterior chamber intraocular lens   Vitreous Normal Normal          IMAGING AND PROCEDURES  Imaging and Procedures for 07/17/19  OCT, Retina - OU - Both Eyes       Right Eye Quality was good. Scan locations included subfoveal.   Left Eye Quality was good. Scan locations included subfoveal.                 ASSESSMENT/PLAN:  No problem-specific Assessment & Plan notes found for this encounter.    No diagnosis found.  1.  2.  3.  Ophthalmic Meds Ordered this visit:  No orders of the defined types were placed in this encounter.      No follow-ups on file.  There are no Patient Instructions on file for this visit.   Explained the diagnoses, plan, and follow up with the patient and they expressed understanding.  Patient expressed understanding of the importance of proper follow up care.   Clent Demark Cheryl Herman M.D. Diseases & Surgery of the Retina and Vitreous Retina & Diabetic [place] 07/17/19     Abbreviations: M myopia (nearsighted); A astigmatism; H hyperopia (farsighted); P presbyopia; Mrx spectacle prescription;  CTL contact lenses; OD right eye; OS left eye; OU both eyes  XT exotropia; ET esotropia; PEK punctate epithelial keratitis; PEE punctate epithelial erosions; DES dry eye syndrome; MGD meibomian gland dysfunction; ATs artificial tears; PFAT's preservative free artificial tears; [place] nuclear sclerotic cataract; PSC posterior subcapsular cataract; ERM epi-retinal membrane; PVD posterior vitreous detachment; RD retinal  detachment; DM diabetes mellitus; DR diabetic retinopathy; NPDR non-proliferative diabetic retinopathy; PDR proliferative diabetic retinopathy; CSME clinically significant macular edema; DME diabetic macular edema; dbh dot blot hemorrhages; CWS cotton wool spot; POAG primary open angle glaucoma; C/D cup-to-disc ratio; HVF humphrey visual field; GVF goldmann visual field; OCT optical coherence tomography; IOP intraocular pressure; BRVO Branch retinal vein occlusion; CRVO central retinal vein occlusion; CRAO  central retinal artery occlusion; BRAO branch retinal artery occlusion; RT retinal tear; SB scleral buckle; PPV pars plana vitrectomy; VH Vitreous hemorrhage; PRP panretinal laser photocoagulation; IVK intravitreal kenalog; VMT vitreomacular traction; MH Macular hole;  NVD neovascularization of the disc; NVE neovascularization elsewhere; AREDS age related eye disease study; ARMD age related macular degeneration; POAG primary open angle glaucoma; EBMD epithelial/anterior basement membrane dystrophy; ACIOL anterior chamber intraocular lens; IOL intraocular lens; PCIOL posterior chamber intraocular lens; Phaco/IOL phacoemulsification with intraocular lens placement; [place] photorefractive keratectomy; LASIK laser assisted in situ keratomileusis; HTN hypertension; DM diabetes mellitus; COPD chronic obstructive pulmonary disease

## 2020-03-22 DIAGNOSIS — M5416 Radiculopathy, lumbar region: Secondary | ICD-10-CM | POA: Diagnosis not present

## 2020-03-26 ENCOUNTER — Ambulatory Visit (INDEPENDENT_AMBULATORY_CARE_PROVIDER_SITE_OTHER): Payer: Medicare Other | Admitting: Otolaryngology

## 2020-03-26 ENCOUNTER — Other Ambulatory Visit: Payer: Self-pay

## 2020-03-26 VITALS — Temp 97.3°F

## 2020-03-26 DIAGNOSIS — R590 Localized enlarged lymph nodes: Secondary | ICD-10-CM | POA: Diagnosis not present

## 2020-03-26 NOTE — Progress Notes (Signed)
HPI: Cheryl Herman is a 80 y.o. female who returns today for evaluation of right upper neck node.  She has some discomfort or pain behind the right ear.  She noticed some swelling below the right ear starting about 7 weeks ago.  It has not been that tender.  She denies any ear infections or drainage from the ear.  She has had no history of skin cancers removed..  Past Medical History:  Diagnosis Date  . Arthritis    oa  . GERD (gastroesophageal reflux disease)   . Hypertension   . Hypothyroidism    Past Surgical History:  Procedure Laterality Date  . ABDOMINAL HYSTERECTOMY     partial  . COLONOSCOPY WITH PROPOFOL N/A 10/27/2013   Procedure: COLONOSCOPY WITH PROPOFOL;  Surgeon: Garlan Fair, MD;  Location: [facility];  Service: Endoscopy;  Laterality: N/A;  . ESOPHAGOGASTRODUODENOSCOPY (EGD) WITH PROPOFOL N/A 10/27/2013   Procedure: ESOPHAGOGASTRODUODENOSCOPY (EGD) WITH PROPOFOL;  Surgeon: Garlan Fair, MD;  Location: [facility];  Service: Endoscopy;  Laterality: N/A;  . partial thryoidectomy  yrs ago   Social History   Socioeconomic History  . Marital status: Married    Spouse name: Not on file  . Number of children: Not on file  . Years of education: Not on file  . Highest education level: Not on file  Occupational History  . Not on file  Tobacco Use  . Smoking status: Never Smoker  . Smokeless tobacco: Never Used  Substance and Sexual Activity  . Alcohol use: No  . Drug use: No  . Sexual activity: Not on file  Other Topics Concern  . Not on file  Social History Narrative  . Not on file   Social Determinants of Health   Financial Resource Strain: Not on file  Food Insecurity: Not on file  Transportation Needs: Not on file  Physical Activity: Not on file  Stress: Not on file  Social Connections: Not on file   No family history on file. No Known Allergies Prior to Admission medications   Medication Sig Start Date End Date Taking? Authorizing  Provider  aspirin EC 81 MG tablet Take 81 mg by mouth every evening.    [provider]  atenolol-chlorthalidone (TENORETIC) 50-25 MG per tablet Take 1 tablet by mouth every morning.    [provider]  atorvastatin (LIPITOR) 10 MG tablet Take 10 mg by mouth every other day.    [provider]  B Complex-C (B-COMPLEX WITH VITAMIN C) tablet Take 1 tablet by mouth daily.    [provider]  cholecalciferol (VITAMIN D) 1000 UNITS tablet Take 1,000 Units by mouth daily.    [provider]  hydrochlorothiazide (HYDRODIURIL) 12.5 MG tablet Take 12.5 mg by mouth every morning. 05/19/19   [provider]  ketorolac (ACULAR) 0.5 % ophthalmic solution Place 1 drop into the right eye every other day.    [provider]  levothyroxine (SYNTHROID) 75 MCG tablet Take 75 mcg by mouth daily. 06/16/19   [provider]  levothyroxine (SYNTHROID, LEVOTHROID) 88 MCG tablet Take 88 mcg by mouth daily before breakfast.    [provider]  metoprolol succinate (TOPROL-XL) 50 MG 24 hr tablet TAKE AS DIRECTED ONCE DAILY 90 DAYS 06/19/19   [provider]  Misc Natural Products (OSTEO BI-FLEX JOINT SHIELD PO) Take 2 tablets by mouth daily.    [provider]  Multiple Vitamin (MULTIVITAMIN WITH MINERALS) TABS tablet Take 1 tablet by mouth daily.  [provider]  omeprazole (PRILOSEC) 40 MG capsule Take 40 mg by mouth daily.    [provider]  psyllium (REGULOID) 0.52 G capsule Take 1.04 g by mouth daily.    [provider]  Vitamins/Minerals TABS Take by mouth.    [provider]     Positive ROS: Otherwise negative  All other systems have been reviewed and were otherwise negative with the exception of those mentioned in the HPI and as above.  Physical Exam: Constitutional: Alert, well-appearing, no acute distress Ears: External ears without lesions or tenderness.  She had little  wax buildup in the right ear that was removed with forceps.  Otherwise the ear canals and TMs were clear bilaterally.  TM was clear on the right side with good mobility on pneumatic otoscopy.  No ear canal inflammation.  The skin around the ear and the right side of face and forehead and hairline was all clear with no lesions noted. Nasal: External nose without lesions. Septum midline. Clear nasal passages bilaterally with no difficulty breathing. Oral: Lips and gums without lesions. Tongue and palate mucosa without lesions. Posterior oropharynx clear.  Tonsil regions are symmetric and benign bilaterally.  Indirect laryngoscopy revealed a clear base of tongue vallecula and epiglottis.  Vocal cords were clear bilaterally hypopharynx was clear. Neck: No palpable adenopathy or masses.  Patient has had a previous thyroidectomy for goiter.  She has a slightly enlarged but soft node just anterior to sternocleidomastoid muscle and just below the angle of the jaw on the right side estimate this to be approximately 2 and half centimeters in size and is somewhat soft.  It is difficult to tell whether this is within the inferior aspect of the tail of the parotid gland versus a separate lymph node.  She has normal facial nerve function. Respiratory: Breathing comfortably  Skin: No facial/neck lesions or rash noted.  Procedures  Assessment: Right upper neck node questionable etiology.  Plan: We will plan on scheduling ultrasound-guided fine-needle aspirate of the node for diagnostic purposes. She will call us concerning results 3 to 4 days post the needle aspirate.   Radene Journey, MD

## 2020-03-30 ENCOUNTER — Other Ambulatory Visit (INDEPENDENT_AMBULATORY_CARE_PROVIDER_SITE_OTHER): Payer: Self-pay

## 2020-03-30 ENCOUNTER — Telehealth (HOSPITAL_COMMUNITY): Payer: Self-pay

## 2020-03-30 DIAGNOSIS — R590 Localized enlarged lymph nodes: Secondary | ICD-10-CM

## 2020-03-31 ENCOUNTER — Other Ambulatory Visit (INDEPENDENT_AMBULATORY_CARE_PROVIDER_SITE_OTHER): Payer: Self-pay

## 2020-03-31 DIAGNOSIS — R590 Localized enlarged lymph nodes: Secondary | ICD-10-CM

## 2020-04-12 ENCOUNTER — Ambulatory Visit
Admission: RE | Admit: 2020-04-12 | Discharge: 2020-04-12 | Disposition: A | Discharge status: Home / Self Care | Payer: Medicare Other | Source: Ambulatory Visit | Attending: Otolaryngology | Admitting: Otolaryngology

## 2020-04-12 DIAGNOSIS — R590 Localized enlarged lymph nodes: Secondary | ICD-10-CM

## 2020-04-13 DIAGNOSIS — M48061 Spinal stenosis, lumbar region without neurogenic claudication: Secondary | ICD-10-CM | POA: Diagnosis not present

## 2020-04-13 DIAGNOSIS — M5136 Other intervertebral disc degeneration, lumbar region: Secondary | ICD-10-CM | POA: Diagnosis not present

## 2020-04-13 DIAGNOSIS — M5416 Radiculopathy, lumbar region: Secondary | ICD-10-CM | POA: Diagnosis not present

## 2020-04-15 ENCOUNTER — Encounter (HOSPITAL_COMMUNITY): Payer: Self-pay

## 2020-04-15 NOTE — Progress Notes (Unsigned)
Cheryl Herman Female, 80 y.o., 1940/12/10  MRN:  [id number] Phone:  [phone] Jerilynn Mages)       PCP:  Kelton Pillar, MD Coverage:  [facility] ([facility] 2) 04/27/2020 at 1:00 PM           RE: Biopsy Received: 2 days ago  Message Details  Markus Daft, MD  Ernestene Mention for US guided FNA/core biopsy.    [place]    Previous Messages  ----- Message -----  From: Lenore Cordia  Sent: 04/13/2020 11:24 AM EST  To: [facility] Procedure Requests  Subject: Biopsy                      Procedure Requested: [place] FNA    Reason for Procedure: Right upper neck cervical lymphadenopathy    Provider Requesting: Melony Overly  Provider Telephone: [phone]   Other Info:

## 2020-04-20 ENCOUNTER — Ambulatory Visit (INDEPENDENT_AMBULATORY_CARE_PROVIDER_SITE_OTHER): Payer: Medicare Other | Admitting: Ophthalmology

## 2020-04-20 ENCOUNTER — Other Ambulatory Visit: Payer: Self-pay

## 2020-04-20 ENCOUNTER — Encounter (INDEPENDENT_AMBULATORY_CARE_PROVIDER_SITE_OTHER): Payer: Self-pay | Admitting: Ophthalmology

## 2020-04-20 DIAGNOSIS — H35351 Cystoid macular degeneration, right eye: Secondary | ICD-10-CM | POA: Diagnosis not present

## 2020-04-20 DIAGNOSIS — H3321 Serous retinal detachment, right eye: Secondary | ICD-10-CM | POA: Diagnosis not present

## 2020-04-20 DIAGNOSIS — H35071 Retinal telangiectasis, right eye: Secondary | ICD-10-CM | POA: Diagnosis not present

## 2020-04-20 NOTE — Assessment & Plan Note (Signed)
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

## 2020-04-20 NOTE — Progress Notes (Signed)
04/20/2020     CHIEF COMPLAINT Patient presents for Retina Follow Up (9 Month f\u OU. OCT/Pt states OD vision is more blurry than before. Pt thinks it may be due to dry eyes. Pt is not currently using Ketorolac)   HISTORY OF PRESENT ILLNESS: Cheryl Herman is a 80 y.o. female who presents to the clinic today for:   HPI    Retina Follow Up    Diagnosis: CME.  In right eye.  Severity is moderate.  Duration of 9 months.  Since onset it is stable.  I, the attending physician,  performed the HPI with the patient and updated documentation appropriately. Additional comments: 9 Month f\u OU. OCT Pt states OD vision is more blurry than before. Pt thinks it may be due to dry eyes. Pt is not currently using Ketorolac       Last edited by Tilda Franco on 04/20/2020  8:58 AM. (History)      Referring physician: Kelton Pillar, MD [address] [place],  [place] [address]  HISTORICAL INFORMATION:   Selected notes from the MEDICAL RECORD NUMBER       CURRENT MEDICATIONS: Current Outpatient Medications (Ophthalmic Drugs)  Medication Sig  . ketorolac (ACULAR) 0.5 % ophthalmic solution Place 1 drop into the right eye every other day.   No current facility-administered medications for this visit. (Ophthalmic Drugs)   Current Outpatient Medications (Other)  Medication Sig  . aspirin EC 81 MG tablet Take 81 mg by mouth every evening.  Marland Kitchen atenolol-chlorthalidone (TENORETIC) 50-25 MG per tablet Take 1 tablet by mouth every morning.  Marland Kitchen atorvastatin (LIPITOR) 10 MG tablet Take 10 mg by mouth every other day.  . B Complex-C (B-COMPLEX WITH VITAMIN C) tablet Take 1 tablet by mouth daily.  . cholecalciferol (VITAMIN D) 1000 UNITS tablet Take 1,000 Units by mouth daily.  . hydrochlorothiazide (HYDRODIURIL) 12.5 MG tablet Take 12.5 mg by mouth every morning.  Marland Kitchen levothyroxine (SYNTHROID) 75 MCG tablet Take 75 mcg by mouth daily.  Marland Kitchen levothyroxine (SYNTHROID, LEVOTHROID) 88 MCG  tablet Take 88 mcg by mouth daily before breakfast.  . metoprolol succinate (TOPROL-XL) 50 MG 24 hr tablet TAKE AS DIRECTED ONCE DAILY 90 DAYS  . Misc Natural Products (OSTEO BI-FLEX JOINT SHIELD PO) Take 2 tablets by mouth daily.  . Multiple Vitamin (MULTIVITAMIN WITH MINERALS) TABS tablet Take 1 tablet by mouth daily.  Marland Kitchen omeprazole (PRILOSEC) 40 MG capsule Take 40 mg by mouth daily.  . psyllium (REGULOID) 0.52 G capsule Take 1.04 g by mouth daily.  . Vitamins/Minerals TABS Take by mouth.   No current facility-administered medications for this visit. (Other)      REVIEW OF SYSTEMS:    ALLERGIES No Known Allergies  PAST MEDICAL HISTORY Past Medical History:  Diagnosis Date  . Arthritis    oa  . GERD (gastroesophageal reflux disease)   . Hypertension   . Hypothyroidism    Past Surgical History:  Procedure Laterality Date  . ABDOMINAL HYSTERECTOMY     partial  . COLONOSCOPY WITH PROPOFOL N/A 10/27/2013   Procedure: COLONOSCOPY WITH PROPOFOL;  Surgeon: Garlan Fair, MD;  Location: [facility];  Service: Endoscopy;  Laterality: N/A;  . ESOPHAGOGASTRODUODENOSCOPY (EGD) WITH PROPOFOL N/A 10/27/2013   Procedure: ESOPHAGOGASTRODUODENOSCOPY (EGD) WITH PROPOFOL;  Surgeon: Garlan Fair, MD;  Location: [facility];  Service: Endoscopy;  Laterality: N/A;  . partial thryoidectomy  yrs ago    FAMILY HISTORY History reviewed. No pertinent family history.  SOCIAL HISTORY Social History   Tobacco Use  . Smoking status: Never Smoker  . Smokeless tobacco: Never Used  Substance Use Topics  . Alcohol use: No  . Drug use: No         OPHTHALMIC EXAM: Base Eye Exam    Visual Acuity (Snellen - Linear)      Right Left   Dist cc 20/200 -1 20/25 -2   Dist ph cc NI    Correction: Glasses       Tonometry (Tonopen, 9:03 AM)      Right Left   Pressure 14 14       Pupils      Dark Light Shape React APD   Right 4 4 Round Minimal None   Left 4 3 Round Slow None        Visual Fields (Counting fingers)      Left Right    Full    Restrictions  Partial outer superior temporal deficiency       Neuro/Psych    Oriented x3: Yes   Mood/Affect: Normal       Dilation    Both eyes: 1.0% Mydriacyl, 2.5% Phenylephrine @ 9:03 AM        Slit Lamp and Fundus Exam    External Exam      Right Left   External Normal Normal       Slit Lamp Exam      Right Left   Lids/Lashes Normal Normal   Conjunctiva/Sclera White and quiet White and quiet   Cornea Clear Clear   Anterior Chamber Deep and quiet Deep and quiet   Iris Round and reactive Round and reactive   Lens Posterior chamber intraocular lens Posterior chamber intraocular lens   Anterior Vitreous Normal Normal       Fundus Exam      Right Left   Posterior Vitreous Clear, vitrectomized Posterior vitreous detachment   Disc 1+ Pallor Normal   C/D Ratio 0.55 0.35   Macula Attached, no macular thickening, no microaneurysms, Retinal pigment epithelial mottling Normal   Vessels Normal Normal   Periphery Good retinopexy peripherally.  Retina attached. Attached 360 Normal, attached 360 no holes           IMAGING AND PROCEDURES  Imaging and Procedures for 04/20/20  OCT, Retina - OU - Both Eyes       Right Eye Quality was good. Scan locations included subfoveal. Central Foveal Thickness: 260. Progression has been stable. Findings include abnormal foveal contour.   Left Eye Quality was good. Scan locations included subfoveal. Central Foveal Thickness: 247. Progression has been stable. Findings include normal foveal contour.   Notes OD macular anatomy has vastly improved since onset of use continually of CPAP, November 2018.  This is concomitant with the cessation of chronic topical NSAIDs which had no impact on the anatomy OD.  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 perifoveal and even temporal  to the macula, previously associated with retinal detachment and its repair and longstanding PVR, not responsive to topical therapy of NSAIDs for her for many years, finally late 2018 patient underwent testing for sleep apnea, found to have sleep apnea and has since November 2018 been on CPAP with complete resolution of the perifoveal CME  Moreover she is coming to rely on the use of CPAP as a improved vast improvement in  her sense of wellbeing and health  Cystoid macular edema of right eye See comments relative to retinal telangiectasia, macula OD  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 Cystoid macular edema of right eye  H35.351 OCT, Retina - OU - Both Eyes  2. Retinal telangiectasia of right eye  H35.071   3. 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  Macular perifoveal CME, and CME distal temporal macula have completely resolved OD, now on continuous use CPAP.  On no topical medical therapy.  2.  No new findings of peripheral retinal disease.  3.  Ophthalmic Meds Ordered this visit:  No orders of the defined types were placed in this encounter.      Return in about 1 year (around 04/20/2021) for DILATE OU, OCT, COLOR FP.  There are no Patient Instructions on file for this visit.   Explained the diagnoses, plan, and follow up with the patient and they expressed understanding.  Patient expressed understanding of the importance of proper follow up care.   Clent Demark Rankin M.D. Diseases & Surgery of the Retina and Vitreous Retina & Diabetic [place] 04/20/20     Abbreviations: M myopia (nearsighted); A astigmatism; H hyperopia (farsighted); P presbyopia; Mrx spectacle prescription;  CTL contact lenses; OD right eye; OS left eye; OU both eyes  XT exotropia; ET esotropia; PEK punctate epithelial keratitis; PEE punctate epithelial erosions; DES dry eye syndrome; MGD meibomian gland dysfunction; ATs artificial tears; PFAT's  preservative free artificial tears; [place] nuclear sclerotic cataract; PSC posterior subcapsular cataract; ERM epi-retinal membrane; PVD posterior vitreous detachment; RD retinal detachment; DM diabetes mellitus; DR diabetic retinopathy; NPDR non-proliferative diabetic retinopathy; PDR proliferative diabetic retinopathy; CSME clinically significant macular edema; DME diabetic macular edema; dbh dot blot hemorrhages; CWS cotton wool spot; POAG primary open angle glaucoma; C/D cup-to-disc ratio; HVF humphrey visual field; GVF goldmann visual field; OCT optical coherence tomography; IOP intraocular pressure; BRVO Branch retinal vein occlusion; CRVO central retinal vein occlusion; CRAO central retinal artery occlusion; BRAO branch retinal artery occlusion; RT retinal tear; SB scleral buckle; PPV pars plana vitrectomy; VH Vitreous hemorrhage; PRP panretinal laser photocoagulation; IVK intravitreal kenalog; VMT vitreomacular traction; MH Macular hole;  NVD neovascularization of the disc; NVE neovascularization elsewhere; AREDS age related eye disease study; ARMD age related macular degeneration; POAG primary open angle glaucoma; EBMD epithelial/anterior basement membrane dystrophy; ACIOL anterior chamber intraocular lens; IOL intraocular lens; PCIOL posterior chamber intraocular lens; Phaco/IOL phacoemulsification with intraocular lens placement; [place] photorefractive keratectomy; LASIK laser assisted in situ keratomileusis; HTN hypertension; DM diabetes mellitus; COPD chronic obstructive pulmonary disease

## 2020-04-20 NOTE — Assessment & Plan Note (Signed)
See comments relative to retinal telangiectasia, macula OD

## 2020-04-20 NOTE — Assessment & Plan Note (Signed)
Able, no new PVR

## 2020-04-22 ENCOUNTER — Encounter (INDEPENDENT_AMBULATORY_CARE_PROVIDER_SITE_OTHER): Payer: Medicare Other | Admitting: Ophthalmology

## 2020-04-26 ENCOUNTER — Other Ambulatory Visit: Payer: Self-pay | Admitting: Radiology

## 2020-04-26 DIAGNOSIS — E7800 Pure hypercholesterolemia, unspecified: Secondary | ICD-10-CM | POA: Diagnosis not present

## 2020-04-26 DIAGNOSIS — H3530 Unspecified macular degeneration: Secondary | ICD-10-CM | POA: Diagnosis not present

## 2020-04-26 DIAGNOSIS — E039 Hypothyroidism, unspecified: Secondary | ICD-10-CM | POA: Diagnosis not present

## 2020-04-26 DIAGNOSIS — G629 Polyneuropathy, unspecified: Secondary | ICD-10-CM | POA: Diagnosis not present

## 2020-04-26 DIAGNOSIS — I129 Hypertensive chronic kidney disease with stage 1 through stage 4 chronic kidney disease, or unspecified chronic kidney disease: Secondary | ICD-10-CM | POA: Diagnosis not present

## 2020-04-26 DIAGNOSIS — Z0000 Encounter for general adult medical examination without abnormal findings: Secondary | ICD-10-CM | POA: Diagnosis not present

## 2020-04-26 DIAGNOSIS — J309 Allergic rhinitis, unspecified: Secondary | ICD-10-CM | POA: Diagnosis not present

## 2020-04-26 DIAGNOSIS — Z1389 Encounter for screening for other disorder: Secondary | ICD-10-CM | POA: Diagnosis not present

## 2020-04-26 DIAGNOSIS — N183 Chronic kidney disease, stage 3 unspecified: Secondary | ICD-10-CM | POA: Diagnosis not present

## 2020-04-26 DIAGNOSIS — G4733 Obstructive sleep apnea (adult) (pediatric): Secondary | ICD-10-CM | POA: Diagnosis not present

## 2020-04-26 DIAGNOSIS — M1990 Unspecified osteoarthritis, unspecified site: Secondary | ICD-10-CM | POA: Diagnosis not present

## 2020-04-26 DIAGNOSIS — K589 Irritable bowel syndrome without diarrhea: Secondary | ICD-10-CM | POA: Diagnosis not present

## 2020-04-27 ENCOUNTER — Encounter (HOSPITAL_COMMUNITY): Payer: Self-pay

## 2020-04-27 ENCOUNTER — Other Ambulatory Visit: Payer: Self-pay

## 2020-04-27 ENCOUNTER — Ambulatory Visit (HOSPITAL_COMMUNITY)
Admission: RE | Admit: 2020-04-27 | Discharge: 2020-04-27 | Disposition: A | Discharge status: Home / Self Care | Payer: Medicare Other | Source: Ambulatory Visit | Attending: Otolaryngology | Admitting: Otolaryngology

## 2020-04-27 ENCOUNTER — Other Ambulatory Visit (INDEPENDENT_AMBULATORY_CARE_PROVIDER_SITE_OTHER): Payer: Self-pay | Admitting: Otolaryngology

## 2020-04-27 DIAGNOSIS — R590 Localized enlarged lymph nodes: Secondary | ICD-10-CM

## 2020-04-27 DIAGNOSIS — C969 Malignant neoplasm of lymphoid, hematopoietic and related tissue, unspecified: Secondary | ICD-10-CM | POA: Diagnosis not present

## 2020-04-27 DIAGNOSIS — D4989 Neoplasm of unspecified behavior of other specified sites: Secondary | ICD-10-CM | POA: Diagnosis not present

## 2020-04-27 DIAGNOSIS — G4733 Obstructive sleep apnea (adult) (pediatric): Secondary | ICD-10-CM | POA: Diagnosis not present

## 2020-04-27 MED ORDER — FENTANYL CITRATE (PF) 100 MCG/2ML IJ SOLN
INTRAMUSCULAR | Status: AC
  Filled 2020-04-27: qty 2

## 2020-04-27 MED ORDER — LIDOCAINE HCL (PF) 1 % IJ SOLN
INTRAMUSCULAR | Status: AC
  Filled 2020-04-27: qty 30

## 2020-04-27 MED ORDER — MIDAZOLAM HCL 2 MG/2ML IJ SOLN
INTRAMUSCULAR | Status: AC
  Filled 2020-04-27: qty 2

## 2020-04-27 MED ORDER — SODIUM CHLORIDE 0.9 % IV SOLN: INTRAVENOUS | Status: DC

## 2020-04-27 MED ORDER — FENTANYL CITRATE (PF) 100 MCG/2ML IJ SOLN
INTRAMUSCULAR | Status: DC | PRN
  Administered 2020-04-27: 50 ug via INTRAVENOUS

## 2020-04-27 MED ORDER — LIDOCAINE HCL (PF) 1 % IJ SOLN
INTRAMUSCULAR | Status: DC | PRN
  Administered 2020-04-27: 2 mL

## 2020-04-27 MED ORDER — MIDAZOLAM HCL 2 MG/2ML IJ SOLN
INTRAMUSCULAR | Status: DC | PRN
  Administered 2020-04-27: 1 mg via INTRAVENOUS

## 2020-04-27 NOTE — Sedation Documentation (Signed)
This RN placed call to Short Stay unit for patient to return for recover post biopsy procedure completion.  Short Stay unable to take report at this time. Provided staff member with phone number for call back.

## 2020-04-27 NOTE — Procedures (Signed)
Interventional Radiology Procedure Note  Procedure: Lymph node biopsy  Indication: Enlarged right neck lymph node  Findings: Please refer to procedural dictation for full description.  Complications: None  EBL: < 10 mL  Miachel Roux, MD [phone]

## 2020-04-27 NOTE — Discharge Instructions (Addendum)
Needle Biopsy, Care After °These instructions tell you how to care for yourself after your procedure. Your doctor may also give you more specific instructions. Call your doctor if you have any problems or questions. °What can I expect after the procedure? °After the procedure, it is common to have: °· Soreness. °· Bruising. °· Mild pain. °Follow these instructions at home: °· Return to your normal activities as told by your doctor. Ask your doctor what activities are safe for you. °· Take over-the-counter and prescription medicines only as told by your doctor. °· Wash your hands with soap and water before you change your bandage (dressing). If you cannot use soap and water, use hand sanitizer. °· Follow instructions from your doctor about: °? How to take care of your puncture site. °? When and how to change your bandage. °? When to remove your bandage. °· Check your puncture site every day for signs of infection. Watch for: °? Redness, swelling, or pain. °? Fluid or blood. °? Pus or a bad smell. °? Warmth. °· Do not take baths, swim, or use a hot tub until your doctor approves. Ask your doctor if you may take showers. You may only be allowed to take sponge baths. °· Keep all follow-up visits as told by your doctor. This is important.   °Contact a doctor if you have: °· A fever. °· Redness, swelling, or pain at the puncture site, and it lasts longer than a few days. °· Fluid, blood, or pus coming from the puncture site. °· Warmth coming from the puncture site. °Get help right away if: °· You have a lot of bleeding from the puncture site. °Summary °· After the procedure, it is common to have soreness, bruising, or mild pain at the puncture site. °· Check your puncture site every day for signs of infection, such as redness, swelling, or pain. °· Get help right away if you have severe bleeding from your puncture site. °This information is not intended to replace advice given to you by your health care provider. Make  sure you discuss any questions you have with your health care provider. °Document Revised: 08/14/2019 Document Reviewed: 08/14/2019 °Elsevier Patient Education © 2021 [employer] °Moderate Conscious Sedation, Adult °Sedation is the use of medicines to promote relaxation and to relieve discomfort and anxiety. Moderate conscious sedation is a type of sedation. Under moderate conscious sedation, you are less alert than normal, but you are still able to respond to instructions, touch, or both. °Moderate conscious sedation is used during short medical and dental procedures. It is milder than deep sedation, which is a type of sedation under which you cannot be easily woken up. It is also milder than general anesthesia, which is the use of medicines to make you unconscious. Moderate conscious sedation allows you to return to your regular activities sooner. °Tell a health care provider about: °· Any allergies you have. °· All medicines you are taking, including vitamins, herbs, eye drops, creams, and over-the-counter medicines. °· Any use of steroids. This includes steroids taken by mouth or as a cream. °· Any problems you or family members have had with sedatives and anesthetic medicines. °· Any blood disorders you have. °· Any surgeries you have had. °· Any medical conditions you have, such as sleep apnea. °· Whether you are pregnant or may be pregnant. °· Any use of cigarettes, alcohol, marijuana, or drugs. °What are the risks? °Generally, this is a safe procedure. However, problems may occur, including: °· Getting too much   medicine (oversedation). °· Nausea. °· Allergic reaction to medicines. °· Trouble breathing. If this happens, a breathing tube may be used. It will be removed when you are awake and breathing on your own. °· Heart trouble. °· Lung trouble. °· Confusion that gets better with time (emergence delirium). °What happens before the procedure? °Staying hydrated °Follow instructions from your health care  provider about hydration, which may include: °· Up to 2 hours before the procedure - you may continue to drink clear liquids, such as water, clear fruit juice, black coffee, and plain tea. °Eating and drinking restrictions °Follow instructions from your health care provider about eating and drinking, which may include: °· 8 hours before the procedure - stop eating heavy meals or foods, such as meat, fried foods, or fatty foods. °· 6 hours before the procedure - stop eating light meals or foods, such as toast or cereal. °· 6 hours before the procedure - stop drinking milk or drinks that contain milk. °· 2 hours before the procedure - stop drinking clear liquids. °Medicines °Ask your health care provider about: °· Changing or stopping your regular medicines. This is especially important if you are taking diabetes medicines or blood thinners. °· Taking medicines such as aspirin and ibuprofen. These medicines can thin your blood. Do not take these medicines unless your health care provider tells you to take them. °· Taking over-the-counter medicines, vitamins, herbs, and supplements. °Tests and exams °· You will have a physical exam. °· You may have blood tests done to show how well: °? Your kidneys and liver work. °? Your blood clots. °General instructions °· Plan to have a responsible adult take you home from the hospital or clinic. °· If you will be going home right after the procedure, plan to have a responsible adult care for you for the time you are told. This is important. °What happens during the procedure? °· You will be given the sedative. The sedative may be given: °? As a pill that you will swallow. It can also be inserted into the rectum. °? As a spray through the nose. °? As an injection into the muscle. °? As an injection into the vein through an IV. °· You may be given oxygen as needed. °· Your breathing, heart rate, and blood pressure will be monitored during the procedure. °· The medical or dental  procedure will be done. °The procedure may vary among health care providers and hospitals.   °What happens after the procedure? °· Your blood pressure, heart rate, breathing rate, and blood oxygen level will be monitored until you leave the hospital or clinic. °· You will get fluids through your IV if needed. °· Do not drive or operate machinery until your health care provider says that it is safe. °Summary °· Sedation is the use of medicines to promote relaxation and to relieve discomfort and anxiety. Moderate conscious sedation is a type of sedation that is used during short medical and dental procedures. °· Tell the health care provider about any medical conditions that you have and about all the medicines that you are taking. °· You will be given the sedative as a pill, a spray through the nose, an injection into the muscle, or an injection into the vein through an IV. Vital signs are monitored during the sedation. °· Moderate conscious sedation allows you to return to your regular activities sooner. °This information is not intended to replace advice given to you by your health care provider. Make sure you discuss   any questions you have with your health care provider. °Document Revised: 06/13/2019 Document Reviewed: 01/09/2019 °Elsevier Patient Education © 2021 [employer] Inc. ° °  ° °

## 2020-04-27 NOTE — H&P (Signed)
Chief Complaint: Patient was seen in consultation today for Right neck-  cervical lymph node biopsy at the request of Newman,Christopher E  Referring Physician(s): Newman,Christopher E  Supervising Physician: Mir, Biochemist, clinical  Patient Status: [facility] - Out-pt  History of Present Illness: Cheryl Herman is a 80 y.o. female   Pt has noticed pain in area of right ear for weeks Feels LN that seems to be enlarging Never a smoker; denies skin lesions or ear infection Was seen by Dr Gwenlyn Saran  [place] 04/12/20: IMPRESSION: 13 mm hypoechoic mass in the region of the right parotid gland. CT of the neck with contrast and/or histologic sampling should be considered.   Request made for biopsy Scheduled today for same    Past Medical History:  Diagnosis Date  . Arthritis    oa  . GERD (gastroesophageal reflux disease)   . Hypertension   . Hypothyroidism     Past Surgical History:  Procedure Laterality Date  . ABDOMINAL HYSTERECTOMY     partial  . COLONOSCOPY WITH PROPOFOL N/A 10/27/2013   Procedure: COLONOSCOPY WITH PROPOFOL;  Surgeon: Garlan Fair, MD;  Location: [facility];  Service: Endoscopy;  Laterality: N/A;  . ESOPHAGOGASTRODUODENOSCOPY (EGD) WITH PROPOFOL N/A 10/27/2013   Procedure: ESOPHAGOGASTRODUODENOSCOPY (EGD) WITH PROPOFOL;  Surgeon: Garlan Fair, MD;  Location: [facility];  Service: Endoscopy;  Laterality: N/A;  . partial thryoidectomy  yrs ago    Allergies: Patient has no known allergies.  Medications: Prior to Admission medications   Medication Sig Start Date End Date Taking? Authorizing Provider  atenolol-chlorthalidone (TENORETIC) 50-25 MG per tablet Take 1 tablet by mouth every morning.   Yes [provider]  atorvastatin (LIPITOR) 10 MG tablet Take 10 mg by mouth every other day.   Yes [provider]  B Complex-C (B-COMPLEX WITH VITAMIN C) tablet Take 1 tablet by mouth daily.   Yes [provider]  cholecalciferol  (VITAMIN D) 1000 UNITS tablet Take 1,000 Units by mouth daily.   Yes [provider]  hydrochlorothiazide (HYDRODIURIL) 12.5 MG tablet Take 12.5 mg by mouth every morning. 05/19/19  Yes [provider]  ketorolac (ACULAR) 0.5 % ophthalmic solution Place 1 drop into the right eye every other day.   Yes [provider]  levothyroxine (SYNTHROID, LEVOTHROID) 88 MCG tablet Take 88 mcg by mouth daily before breakfast.   Yes [provider]  metoprolol succinate (TOPROL-XL) 50 MG 24 hr tablet TAKE AS DIRECTED ONCE DAILY 90 DAYS 06/19/19  Yes [provider]  Misc Natural Products (OSTEO BI-FLEX JOINT SHIELD PO) Take 2 tablets by mouth daily.   Yes [provider]  Multiple Vitamin (MULTIVITAMIN WITH MINERALS) TABS tablet Take 1 tablet by mouth daily.   Yes [provider]  omeprazole (PRILOSEC) 40 MG capsule Take 40 mg by mouth daily.   Yes [provider]  Vitamins/Minerals TABS Take by mouth.   Yes [provider]  aspirin EC 81 MG tablet Take 81 mg by mouth every evening.    [provider]  levothyroxine (SYNTHROID) 75 MCG tablet Take 75 mcg by mouth daily. 06/16/19   [provider]  psyllium (REGULOID) 0.52 G capsule Take 1.04 g by mouth daily.    [provider]     History reviewed. No pertinent family history.  Social History   Socioeconomic History  . Marital status: Married    Spouse name: Not on file  . Number of children: Not on file  . Years  of education: Not on file  . Highest education level: Not on file  Occupational History  . Not on file  Tobacco Use  . Smoking status: Never Smoker  . Smokeless tobacco: Never Used  Substance and Sexual Activity  . Alcohol use: No  . Drug use: No  . Sexual activity: Not on file  Other Topics Concern  . Not on file  Social History Narrative  . Not on file   Social Determinants of Health   Financial Resource Strain: Not on file   Food Insecurity: Not on file  Transportation Needs: Not on file  Physical Activity: Not on file  Stress: Not on file  Social Connections: Not on file    Review of Systems: A 12 point ROS discussed and pertinent positives are indicated in the HPI above.  All other systems are negative.  Review of Systems  Constitutional: Negative for activity change, fatigue and fever.  HENT: Negative for sinus pressure, sore throat, tinnitus and trouble swallowing.   Respiratory: Negative for cough and shortness of breath.   Cardiovascular: Negative for chest pain.  Gastrointestinal: Negative for abdominal pain.  Psychiatric/Behavioral: Negative for behavioral problems and confusion.    Vital Signs: BP (!) 179/81   Pulse 67   Temp 98.4 F (36.9 C)   Ht 5\' 5"  (1.651 m)   Wt 162 lb (73.5 kg)   SpO2 97%   BMI 26.96 kg/m   Physical Exam Vitals reviewed.  HENT:     Mouth/Throat:     Mouth: Mucous membranes are moist.  Cardiovascular:     Rate and Rhythm: Normal rate and regular rhythm.     Heart sounds: Normal heart sounds.  Pulmonary:     Effort: Pulmonary effort is normal.     Breath sounds: Normal breath sounds.  Abdominal:     Palpations: Abdomen is soft.  Musculoskeletal:        General: Normal range of motion.     Cervical back: Normal range of motion.  Skin:    General: Skin is warm.  Neurological:     Mental Status: She is alert and oriented to person, place, and time.  Psychiatric:        Behavior: Behavior normal.     Imaging: US Soft Tissue Head/Neck (NON-THYROID)  Result Date: 04/12/2020 CLINICAL DATA:  Right cervical lymphadenopathy EXAM: ULTRASOUND OF HEAD/NECK SOFT TISSUES TECHNIQUE: Ultrasound examination of the head and neck soft tissues was performed in the area of clinical concern. COMPARISON:  None. FINDINGS: In the region of the right parotid gland, there is a hypoechoic mass with internal blood flow that measures 13 mm. IMPRESSION: 13 mm hypoechoic mass in  the region of the right parotid gland. CT of the neck with contrast and/or histologic sampling should be considered. Electronically Signed   By: Ulyses Jarred M.D.   On: 04/12/2020 21:30   OCT, Retina - OU - Both Eyes  Result Date: 04/20/2020 Right Eye Quality was good. Scan locations included subfoveal. Central Foveal Thickness: 260. Progression has been stable. Findings include abnormal foveal contour. Left Eye Quality was good. Scan locations included subfoveal. Central Foveal Thickness: 247. Progression has been stable. Findings include normal foveal contour. Notes OD macular anatomy has vastly improved since onset of use continually of CPAP, November 2018.  This is concomitant with the cessation of chronic topical NSAIDs which had no impact on the anatomy OD. OS normal, incidental posterior vitreous detachment   Labs:  CBC: No results for input(s): WBC, HGB,  HCT, PLT in the last 8760 hours.  COAGS: No results for input(s): INR, APTT in the last 8760 hours.  BMP: No results for input(s): NA, K, CL, CO2, GLUCOSE, BUN, CALCIUM, CREATININE, GFRNONAA, GFRAA in the last 8760 hours.  Invalid input(s): CMP  LIVER FUNCTION TESTS: No results for input(s): BILITOT, AST, ALT, ALKPHOS, PROT, ALBUMIN in the last 8760 hours.  TUMOR MARKERS: No results for input(s): AFPTM, CEA, CA199, CHROMGRNA in the last 8760 hours.  Assessment and Plan:  Right neck lymph node enlargement Seems to be bigger after several weeks CT findings showing enlarged node; samples suggested Scheduled now for biopsy Risks and benefits of right cervical lymph node biopsy was discussed with the patient and/or patient's family including, but not limited to bleeding, infection, damage to adjacent structures or low yield requiring additional tests.  All of the questions were answered and there is agreement to proceed.  Consent signed and in chart.   Thank you for this interesting consult.  I greatly enjoyed meeting  Cheryl Herman and look forward to participating in their care.  A copy of this report was sent to the requesting provider on this date.  Electronically Signed: Lavonia Drafts, PA-C 04/27/2020, 11:43 AM   I spent a total of  30 Minutes   in face to face in clinical consultation, greater than 50% of which was counseling/coordinating care for right cervical LN bx

## 2020-05-04 LAB — SURGICAL PATHOLOGY

## 2020-05-06 ENCOUNTER — Telehealth (INDEPENDENT_AMBULATORY_CARE_PROVIDER_SITE_OTHER): Payer: Self-pay | Admitting: Otolaryngology

## 2020-05-06 DIAGNOSIS — G4733 Obstructive sleep apnea (adult) (pediatric): Secondary | ICD-10-CM | POA: Diagnosis not present

## 2020-05-06 NOTE — Telephone Encounter (Signed)
Called patient today concerning results of her needle biopsy of right upper neck lymph node.  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 Discussed with her that she will need further evaluation with urology concerning follow-up of the renal cell tumor.  I discussed the patient with Dr. Junious Silk as well as with her PCP Dr. Laurann Montana.

## 2020-05-10 ENCOUNTER — Telehealth: Payer: Self-pay | Admitting: Oncology

## 2020-05-10 NOTE — Telephone Encounter (Signed)
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 Ms. Shands has been cld and scheduled to see Dr. Alen Blew on 3/24 at 11am. Appt date and time has been given to the pt's daughter. Aware to arrive 20 minutes early.

## 2020-05-11 ENCOUNTER — Telehealth: Payer: Self-pay | Admitting: Oncology

## 2020-05-11 NOTE — Telephone Encounter (Signed)
Ms. Yagi has been rescheduled to see Dr. Alen Blew on 3/17 at 2pm. She was scheduled on 3/24, but provider had a cancellation. New appt date and time has been given to the pt's daughter.

## 2020-05-13 ENCOUNTER — Encounter: Payer: Self-pay | Admitting: Oncology

## 2020-05-13 ENCOUNTER — Other Ambulatory Visit: Payer: Self-pay

## 2020-05-13 ENCOUNTER — Inpatient Hospital Stay: Payer: Medicare Other | Attending: Oncology | Admitting: Oncology

## 2020-05-13 DIAGNOSIS — I10 Essential (primary) hypertension: Secondary | ICD-10-CM | POA: Insufficient documentation

## 2020-05-13 DIAGNOSIS — Z79899 Other long term (current) drug therapy: Secondary | ICD-10-CM | POA: Insufficient documentation

## 2020-05-13 DIAGNOSIS — C649 Malignant neoplasm of unspecified kidney, except renal pelvis: Secondary | ICD-10-CM | POA: Diagnosis not present

## 2020-05-13 DIAGNOSIS — N2889 Other specified disorders of kidney and ureter: Secondary | ICD-10-CM

## 2020-05-13 DIAGNOSIS — Z85528 Personal history of other malignant neoplasm of kidney: Secondary | ICD-10-CM | POA: Insufficient documentation

## 2020-05-13 DIAGNOSIS — Z7982 Long term (current) use of aspirin: Secondary | ICD-10-CM | POA: Insufficient documentation

## 2020-05-13 DIAGNOSIS — E039 Hypothyroidism, unspecified: Secondary | ICD-10-CM | POA: Insufficient documentation

## 2020-05-13 NOTE — Progress Notes (Signed)
Reason for the request:   Oncocytic neoplasm  HPI: I was asked by Dr. Alinda Money to evaluate Cheryl Herman to evaluate her for kidney neoplasm.  She is an 80 year old woman with history of hypertension, hypothyroidism who was found to have kidney mass dating back to 2017.  At that time she presented with symptoms of constipation and abdominal pain and a CT scan of the abdomen and pelvis showed a 1.4 cm lesion in the right kidney.  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  She subsequently was evaluated by Dr. Alinda Money and repeat imaging studies in 2020 showed the renal mass of minimally changed to 1.6 cm.  She noted recently mass in the right upper neck and was evaluated by Dr. Lucia Gaskins.  Ultrasound of the neck was obtained which showed a 13 mm hypoechoic mass in the region of the right parotid gland.  Ultrasound-guided biopsy was completed on April 27, 2020 which showed oncocytic neoplasm.  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    Clinically, she reports no major complaints at this time.  She denies any constitutional symptoms fevers chills sweats.  She denies any lymphadenopathy.  She did report a minor discomfort associated with her right neck mass.  She does not report any headaches, blurry vision, syncope or seizures. Does not report any fevers, chills or sweats.  Does not report any cough, wheezing or hemoptysis.  Does not report any chest pain, palpitation, orthopnea or leg edema.  Does not report any nausea, vomiting or abdominal pain.  Does not report any constipation or diarrhea.  Does not report any skeletal complaints.    Does not report frequency, urgency or hematuria.  Does not report any skin rashes or lesions. Does not report any heat or cold intolerance.  Does not report any  lymphadenopathy or petechiae.  Does not report any anxiety or depression.  Remaining review of systems is negative.    Past Medical History:  Diagnosis Date  . Arthritis    oa  . GERD (gastroesophageal reflux disease)   . Hypertension   . Hypothyroidism   :  Past Surgical History:  Procedure Laterality Date  . ABDOMINAL HYSTERECTOMY     partial  . COLONOSCOPY WITH PROPOFOL N/A 10/27/2013   Procedure: COLONOSCOPY WITH PROPOFOL;  Surgeon: Garlan Fair, MD;  Location: [facility];  Service: Endoscopy;  Laterality: N/A;  . ESOPHAGOGASTRODUODENOSCOPY (EGD) WITH PROPOFOL N/A 10/27/2013   Procedure: ESOPHAGOGASTRODUODENOSCOPY (EGD) WITH PROPOFOL;  Surgeon: Garlan Fair, MD;  Location: [facility];  Service: Endoscopy;  Laterality: N/A;  . partial thryoidectomy  yrs ago  :   Current Outpatient Medications:  .  aspirin EC 81 MG tablet, Take 81 mg by mouth every evening., Disp: , Rfl:  .  atenolol-chlorthalidone (TENORETIC) 50-25 MG per tablet, Take 1 tablet by mouth every morning., Disp: , Rfl:  .  atorvastatin (LIPITOR) 10 MG tablet, Take 10 mg by mouth every other day., Disp: , Rfl:  .  B Complex-C (B-COMPLEX WITH VITAMIN C) tablet, Take 1 tablet by mouth daily., Disp: , Rfl:  .  cholecalciferol (VITAMIN D) 1000 UNITS tablet, Take 1,000 Units by mouth daily., Disp: , Rfl:  .  hydrochlorothiazide (HYDRODIURIL) 12.5 MG tablet, Take 12.5 mg by mouth every morning., Disp: , Rfl:  .  ketorolac (ACULAR)  0.5 % ophthalmic solution, Place 1 drop into the right eye every other day., Disp: , Rfl:  .  levothyroxine (SYNTHROID, LEVOTHROID) 88 MCG tablet, Take 88 mcg by mouth daily before breakfast., Disp: , Rfl:  .  metoprolol succinate (TOPROL-XL) 50 MG 24 hr tablet, TAKE AS DIRECTED ONCE DAILY 90 DAYS, Disp: , Rfl:  .  Misc Natural Products (OSTEO BI-FLEX JOINT SHIELD PO), Take 2 tablets by mouth daily., Disp: , Rfl:  .  Multiple Vitamin (MULTIVITAMIN WITH MINERALS) TABS tablet, Take 1  tablet by mouth daily., Disp: , Rfl:  .  omeprazole (PRILOSEC) 40 MG capsule, Take 40 mg by mouth daily., Disp: , Rfl:  .  psyllium (REGULOID) 0.52 G capsule, Take 1.04 g by mouth daily., Disp: , Rfl:  .  Vitamins/Minerals TABS, Take by mouth., Disp: , Rfl: :  No Known Allergies:  No family history on file.:  Social History   Socioeconomic History  . Marital status: Married    Spouse name: Not on file  . Number of children: Not on file  . Years of education: Not on file  . Highest education level: Not on file  Occupational History  . Not on file  Tobacco Use  . Smoking status: Never Smoker  . Smokeless tobacco: Never Used  Substance and Sexual Activity  . Alcohol use: No  . Drug use: No  . Sexual activity: Not on file  Other Topics Concern  . Not on file  Social History Narrative  . Not on file   Social Determinants of Health   Financial Resource Strain: Not on file  Food Insecurity: Not on file  Transportation Needs: Not on file  Physical Activity: Not on file  Stress: Not on file  Social Connections: Not on file  Intimate Partner Violence: Not on file  :  Pertinent items are noted in HPI.  Exam: Blood pressure 140/65, pulse 79, temperature 97.8 F (36.6 C), temperature source Tympanic, resp. rate 14, height 5\' 5"  (1.651 m), weight 162 lb (73.5 kg), SpO2 100 %.  ECOG is 0  General appearance: alert and cooperative appeared without distress. Head: atraumatic without any abnormalities. Neck: Palpable mass around the right jaw angle.  No lymphadenopathy noted. Eyes: conjunctivae/corneas clear. PERRL.  Sclera anicteric. Throat: lips, mucosa, and tongue normal; without oral thrush or ulcers. Resp: clear to auscultation bilaterally without rhonchi, wheezes or dullness to percussion. Cardio: regular rate and rhythm, S1, S2 normal, no murmur, click, rub or gallop GI: soft, non-tender; bowel sounds normal; no masses,  no organomegaly Skin: Skin color, texture, turgor  normal. No rashes or lesions Lymph nodes: Cervical, supraclavicular, and axillary nodes normal. Neurologic: Grossly normal without any motor, sensory or deep tendon reflexes. Musculoskeletal: No joint deformity or effusion.   [place] CORE BIOPSY (LYMPH NODES)  Result Date: 04/27/2020 INDICATION: Right cervical lymphadenopathy EXAM: ULTRASOUND GUIDED CORE BIOPSY OF ENLARGED RIGHT NECK LYMPH NODE MEDICATIONS: None. ANESTHESIA/SEDATION: Fentanyl 50 mcg IV; Versed 1 mg IV Moderate Sedation Time:  12 minutes The patient was continuously monitored during the procedure by the interventional radiology nurse under my direct supervision. PROCEDURE: The procedure, risks, benefits, and alternatives were explained to the patient. Questions regarding the procedure were encouraged and answered. The patient understands and consents to the procedure. The right neck was prepped with chlorhexidine in a sterile fashion, and a sterile drape was applied covering the operative field. A sterile gown and sterile gloves were used for the procedure. Local anesthesia was provided with 1% Lidocaine. Patient  position supine on the ultrasound table. Following local lidocaine administration, four 18 gauge cores were obtained from the enlarged right neck lymph node utilizing continuous ultrasound guidance. Samples were sent to pathology in sterile saline. Needle removed and hemostasis achieved with 5 minutes of manual compression. Post procedure ultrasound images showed no evidence of significant hemorrhage. COMPLICATIONS: None immediate. IMPRESSION: Ultrasound-guided biopsy of enlarged right neck lymph node as above. Electronically Signed   By: Miachel Roux M.D.   On: 04/27/2020 17:21    Assessment and Plan:   80 year old woman with:  1.  Oncocytic neoplasm involving right cervical area.  She presented with an enlarging neck mass in the last 3 months and underwent biopsy on March 1 which confirmed the presence of this tumor.  The  differential diagnosis including a salivary gland tumor versus metastatic chromophobe renal cell carcinoma.   The natural course of this disease and management options were reviewed.  She does have history of oncocytic tumor of the kidney diagnosed in 2017 but the likelihood of transforming into a malignant chromophobe malignant kidney cancer is low at this time.  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  From a management standpoint, I would like to update her staging scans including CT scan neck, chest, abdomen and pelvis to fully stage her at this time and determine whether we are dealing with a malignant metastasis of a kidney tumor versus a benign oncocytic neoplasm.  After obtaining any imaging studies will communicate these results to her.  Management of these tumors is more conservative at this time.  Surgical resection is not required unless there is any specific symptoms noted.   2.  Right kidney neoplasm diagnosed in 2017.  Tissue biopsy confirmed the presence of oncocytic tumor which has not changed dramatically between 2017 and 2020.  I doubt that we are dealing with a transformation to a more malignant tumor at this time.  We will update her staging scans in any case determine if any changes are required.   3.  Follow-up: Will be determined pending her CT evaluation.  60  minutes were dedicated to this visit. The time was spent on reviewing  imaging studies, discussing treatment options, discussing differential diagnosis and answering questions regarding future plan.   A copy of this consult has been forwarded to the requesting physician.

## 2020-05-14 ENCOUNTER — Telehealth: Payer: Self-pay | Admitting: Oncology

## 2020-05-14 DIAGNOSIS — C649 Malignant neoplasm of unspecified kidney, except renal pelvis: Secondary | ICD-10-CM | POA: Insufficient documentation

## 2020-05-14 NOTE — Telephone Encounter (Signed)
Left message with follow-up appointments per 3/17 los. Gave option to call back to reschedule if needed.

## 2020-05-20 ENCOUNTER — Ambulatory Visit: Payer: Medicare Other | Admitting: Oncology

## 2020-05-24 ENCOUNTER — Telehealth: Payer: Self-pay

## 2020-05-24 NOTE — Telephone Encounter (Signed)
-----   Message from Wyatt Portela, MD sent at 05/24/2020  9:23 AM EDT ----- Will keep CT as ordered. Thanks ----- Message ----- From: Tami Lin, RN Sent: 05/24/2020   9:11 AM EDT To: Wyatt Portela, MD  Tedra Coupe from Radiology called and is requesting you change the order for CT Chest/Abd/Pelvis to W/WO contrast.  Lanelle Bal

## 2020-05-24 NOTE — Telephone Encounter (Signed)
See note below

## 2020-05-25 ENCOUNTER — Inpatient Hospital Stay: Payer: Medicare Other

## 2020-05-25 ENCOUNTER — Ambulatory Visit (HOSPITAL_COMMUNITY)
Admission: RE | Admit: 2020-05-25 | Discharge: 2020-05-25 | Disposition: A | Discharge status: Home / Self Care | Payer: Medicare Other | Source: Ambulatory Visit | Attending: Oncology | Admitting: Oncology

## 2020-05-25 ENCOUNTER — Ambulatory Visit (HOSPITAL_COMMUNITY): Payer: Medicare Other

## 2020-05-25 ENCOUNTER — Encounter (HOSPITAL_COMMUNITY): Payer: Self-pay

## 2020-05-25 ENCOUNTER — Other Ambulatory Visit: Payer: Self-pay

## 2020-05-25 DIAGNOSIS — Z7982 Long term (current) use of aspirin: Secondary | ICD-10-CM | POA: Diagnosis not present

## 2020-05-25 DIAGNOSIS — K5730 Diverticulosis of large intestine without perforation or abscess without bleeding: Secondary | ICD-10-CM | POA: Diagnosis not present

## 2020-05-25 DIAGNOSIS — N2889 Other specified disorders of kidney and ureter: Secondary | ICD-10-CM | POA: Insufficient documentation

## 2020-05-25 DIAGNOSIS — Z79899 Other long term (current) drug therapy: Secondary | ICD-10-CM | POA: Diagnosis not present

## 2020-05-25 DIAGNOSIS — R599 Enlarged lymph nodes, unspecified: Secondary | ICD-10-CM | POA: Insufficient documentation

## 2020-05-25 DIAGNOSIS — K118 Other diseases of salivary glands: Secondary | ICD-10-CM | POA: Diagnosis not present

## 2020-05-25 DIAGNOSIS — E039 Hypothyroidism, unspecified: Secondary | ICD-10-CM | POA: Diagnosis not present

## 2020-05-25 DIAGNOSIS — E890 Postprocedural hypothyroidism: Secondary | ICD-10-CM | POA: Diagnosis not present

## 2020-05-25 DIAGNOSIS — I6529 Occlusion and stenosis of unspecified carotid artery: Secondary | ICD-10-CM | POA: Diagnosis not present

## 2020-05-25 DIAGNOSIS — C641 Malignant neoplasm of right kidney, except renal pelvis: Secondary | ICD-10-CM | POA: Diagnosis not present

## 2020-05-25 DIAGNOSIS — R918 Other nonspecific abnormal finding of lung field: Secondary | ICD-10-CM | POA: Diagnosis not present

## 2020-05-25 DIAGNOSIS — I2510 Atherosclerotic heart disease of native coronary artery without angina pectoris: Secondary | ICD-10-CM | POA: Insufficient documentation

## 2020-05-25 DIAGNOSIS — M47814 Spondylosis without myelopathy or radiculopathy, thoracic region: Secondary | ICD-10-CM | POA: Diagnosis not present

## 2020-05-25 DIAGNOSIS — J9811 Atelectasis: Secondary | ICD-10-CM | POA: Diagnosis not present

## 2020-05-25 DIAGNOSIS — Z85528 Personal history of other malignant neoplasm of kidney: Secondary | ICD-10-CM | POA: Diagnosis not present

## 2020-05-25 DIAGNOSIS — I10 Essential (primary) hypertension: Secondary | ICD-10-CM | POA: Diagnosis not present

## 2020-05-25 DIAGNOSIS — I700 Atherosclerosis of aorta: Secondary | ICD-10-CM | POA: Diagnosis not present

## 2020-05-25 LAB — CMP (CANCER CENTER ONLY)
ALT: 24 U/L (ref 0–44)
AST: 36 U/L (ref 15–41)
Albumin: 4.3 g/dL (ref 3.5–5.0)
Alkaline Phosphatase: 45 U/L (ref 38–126)
Anion gap: 13 (ref 5–15)
BUN: 20 mg/dL (ref 8–23)
CO2: 30 mmol/L (ref 22–32)
Calcium: 9.4 mg/dL (ref 8.9–10.3)
Chloride: 97 mmol/L — ABNORMAL LOW (ref 98–111)
Creatinine: 1.07 mg/dL — ABNORMAL HIGH (ref 0.44–1.00)
GFR, Estimated: 52 mL/min — ABNORMAL LOW (ref >60)
Glucose, Bld: 92 mg/dL (ref 70–99)
Potassium: 4.2 mmol/L (ref 3.5–5.1)
Sodium: 140 mmol/L (ref 135–145)
Total Bilirubin: 0.7 mg/dL (ref 0.3–1.2)
Total Protein: 7 g/dL (ref 6.5–8.1)

## 2020-05-25 MED ORDER — IOHEXOL 300 MG/ML  SOLN
100.0000 mL | Freq: Once | INTRAMUSCULAR | Status: AC | PRN
  Administered 2020-05-25: 100 mL via INTRAVENOUS

## 2020-05-26 ENCOUNTER — Other Ambulatory Visit: Payer: Self-pay | Admitting: Oncology

## 2020-05-26 NOTE — Progress Notes (Signed)
The results of the scan was personally reviewed and discussed with the patient's daughter today and all questions were answered.  The primary right renal neoplasm has not changed dramatically over the last 2 years which indicates more oncocytic neoplasm rather than a malignant tumor.  We see no evidence of metastatic disease at this time.  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  All her questions were answered today to her satisfaction.

## 2020-06-02 DIAGNOSIS — M7031 Other bursitis of elbow, right elbow: Secondary | ICD-10-CM | POA: Diagnosis not present

## 2020-06-02 DIAGNOSIS — S59901A Unspecified injury of right elbow, initial encounter: Secondary | ICD-10-CM | POA: Diagnosis not present

## 2020-06-10 DIAGNOSIS — M7021 Olecranon bursitis, right elbow: Secondary | ICD-10-CM | POA: Diagnosis not present

## 2020-06-21 DIAGNOSIS — M48061 Spinal stenosis, lumbar region without neurogenic claudication: Secondary | ICD-10-CM | POA: Diagnosis not present

## 2020-06-21 DIAGNOSIS — M5416 Radiculopathy, lumbar region: Secondary | ICD-10-CM | POA: Diagnosis not present

## 2020-06-24 DIAGNOSIS — M7021 Olecranon bursitis, right elbow: Secondary | ICD-10-CM | POA: Diagnosis not present

## 2020-07-28 DIAGNOSIS — M2550 Pain in unspecified joint: Secondary | ICD-10-CM | POA: Diagnosis not present

## 2020-07-28 DIAGNOSIS — M5136 Other intervertebral disc degeneration, lumbar region: Secondary | ICD-10-CM | POA: Diagnosis not present

## 2020-07-28 DIAGNOSIS — M5416 Radiculopathy, lumbar region: Secondary | ICD-10-CM | POA: Diagnosis not present

## 2020-07-28 DIAGNOSIS — M154 Erosive (osteo)arthritis: Secondary | ICD-10-CM | POA: Diagnosis not present

## 2020-07-28 DIAGNOSIS — M150 Primary generalized (osteo)arthritis: Secondary | ICD-10-CM | POA: Diagnosis not present

## 2020-07-28 DIAGNOSIS — M48061 Spinal stenosis, lumbar region without neurogenic claudication: Secondary | ICD-10-CM | POA: Diagnosis not present

## 2020-08-05 DIAGNOSIS — G4733 Obstructive sleep apnea (adult) (pediatric): Secondary | ICD-10-CM | POA: Diagnosis not present

## 2020-09-08 DIAGNOSIS — G4733 Obstructive sleep apnea (adult) (pediatric): Secondary | ICD-10-CM | POA: Diagnosis not present

## 2020-09-23 DIAGNOSIS — M5416 Radiculopathy, lumbar region: Secondary | ICD-10-CM | POA: Diagnosis not present

## 2020-09-28 ENCOUNTER — Other Ambulatory Visit: Payer: Self-pay

## 2020-09-28 ENCOUNTER — Encounter: Payer: Self-pay | Admitting: Physician Assistant

## 2020-09-28 ENCOUNTER — Ambulatory Visit: Payer: Medicare Other | Admitting: Physician Assistant

## 2020-09-28 DIAGNOSIS — L820 Inflamed seborrheic keratosis: Secondary | ICD-10-CM | POA: Diagnosis not present

## 2020-09-28 DIAGNOSIS — D485 Neoplasm of uncertain behavior of skin: Secondary | ICD-10-CM | POA: Diagnosis not present

## 2020-09-28 DIAGNOSIS — B078 Other viral warts: Secondary | ICD-10-CM | POA: Diagnosis not present

## 2020-09-28 DIAGNOSIS — Z1283 Encounter for screening for malignant neoplasm of skin: Secondary | ICD-10-CM | POA: Diagnosis not present

## 2020-09-28 NOTE — Progress Notes (Signed)
   Follow-Up Visit   Subjective  Cheryl Herman is a 80 y.o. female who presents for the following: Annual Exam (Here for annual skin exam. Concerns is place in left ear. Patient wants it removed. No history of skin cancers. ).   The following portions of the chart were reviewed this encounter and updated as appropriate:  Tobacco  Allergies  Meds  Problems  Med Hx  Surg Hx  Fam Hx      Objective  Well appearing patient in no apparent distress; mood and affect are within normal limits.  A full examination was performed including scalp, head, eyes, ears, nose, lips, neck, chest, axillae, abdomen, back, buttocks, bilateral upper extremities, bilateral lower extremities, hands, feet, fingers, toes, fingernails, and toenails. All findings within normal limits unless otherwise noted below.  Left Scaphoid Fossa Verrucous growth       Left Anterior Mandible, Right Temple Brown crust on erythematous base  Assessment & Plan  Neoplasm of uncertain behavior of skin Left Scaphoid Fossa  Skin / nail biopsy Type of biopsy: tangential   Informed consent: discussed and consent obtained   Timeout: patient name, date of birth, surgical site, and procedure verified   Procedure prep:  Patient was prepped and draped in usual sterile fashion (Non sterile) Prep type:  Chlorhexidine Anesthesia: the lesion was anesthetized in a standard fashion   Anesthetic:  1% lidocaine w/ epinephrine 1-100,000 local infiltration Instrument used: flexible razor blade   Outcome: patient tolerated procedure well   Post-procedure details: wound care instructions given   Additional details:  Cautery only  Specimen 1 - Surgical pathology Differential Diagnosis: bcc vs scc, wart  Check Margins: No  Seborrheic keratosis, inflamed Right Temple; Left Anterior Mandible  Destruction of lesion - Left Anterior Mandible, Right Temple Complexity: simple   Destruction method: cryotherapy   Informed consent:  discussed and consent obtained   Timeout:  patient name, date of birth, surgical site, and procedure verified Lesion destroyed using liquid nitrogen: Yes   Cryotherapy cycles:  3 Outcome: patient tolerated procedure well with no complications     I, Rihana Kiddy, PA-C, have reviewed all documentation's for this visit.  The documentation on 09/28/20 for the exam, diagnosis, procedures and orders are all accurate and complete.

## 2020-09-28 NOTE — Patient Instructions (Signed)

## 2020-10-14 DIAGNOSIS — M5416 Radiculopathy, lumbar region: Secondary | ICD-10-CM | POA: Diagnosis not present

## 2020-10-14 DIAGNOSIS — M461 Sacroiliitis, not elsewhere classified: Secondary | ICD-10-CM | POA: Diagnosis not present

## 2020-10-14 DIAGNOSIS — M5136 Other intervertebral disc degeneration, lumbar region: Secondary | ICD-10-CM | POA: Diagnosis not present

## 2020-10-28 DIAGNOSIS — M461 Sacroiliitis, not elsewhere classified: Secondary | ICD-10-CM | POA: Diagnosis not present

## 2020-11-05 DIAGNOSIS — I129 Hypertensive chronic kidney disease with stage 1 through stage 4 chronic kidney disease, or unspecified chronic kidney disease: Secondary | ICD-10-CM | POA: Diagnosis not present

## 2020-11-12 ENCOUNTER — Other Ambulatory Visit: Payer: Self-pay

## 2020-11-12 ENCOUNTER — Inpatient Hospital Stay: Payer: Medicare Other | Attending: Oncology | Admitting: Oncology

## 2020-11-12 VITALS — BP 150/63 | HR 78 | Temp 98.6°F | Resp 17 | Ht 65.0 in | Wt 154.8 lb

## 2020-11-12 DIAGNOSIS — C649 Malignant neoplasm of unspecified kidney, except renal pelvis: Secondary | ICD-10-CM | POA: Diagnosis not present

## 2020-11-12 DIAGNOSIS — D4101 Neoplasm of uncertain behavior of right kidney: Secondary | ICD-10-CM | POA: Diagnosis not present

## 2020-11-12 DIAGNOSIS — N2889 Other specified disorders of kidney and ureter: Secondary | ICD-10-CM | POA: Diagnosis not present

## 2020-11-12 DIAGNOSIS — D110 Benign neoplasm of parotid gland: Secondary | ICD-10-CM | POA: Insufficient documentation

## 2020-11-12 NOTE — Progress Notes (Signed)
Hematology and Oncology Follow Up Visit  Cheryl Herman [id number] 1940-07-18 80 y.o. 11/12/2020 10:30 AM Cheryl Herman, MDGriffin, Margaretha Sheffield, MD   Principle Diagnosis: 15 year old woman with kidney tumor diagnosed in 2017.  She was found to have 1.4 cm right kidney lesion with tissue biopsy showed an oncocytic neoplasm.   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   Current therapy: Active surveillance.    Interim History: Cheryl Herman returns today for a follow-up visit.  Since her last visit, she reports no major changes in her health.  He continues to feel the right carotid gland which has not changed dramatically.  She denies any nausea, vomiting or abdominal pain.  She denies any distention or changes in bowels.  She does report a lot of stress in her life taking care of her spouse with multiple health issues.     Medications: I have reviewed the patient's current medications.  Current Outpatient Medications  Medication Sig Dispense Refill   atorvastatin (LIPITOR) 10 MG tablet Take 1 tablet by mouth every other day.     Herman Complex-C (Herman-COMPLEX WITH VITAMIN C) tablet Take 1 tablet by mouth daily.     cholecalciferol (VITAMIN D) 1000 UNITS tablet Take 1,000 Units by mouth daily.     diclofenac (VOLTAREN) 50 MG EC tablet Take 50 mg by mouth 2 (two) times daily.     gabapentin (NEURONTIN) 100 MG capsule Take 100 mg by mouth every 8 (eight) hours.     hydrochlorothiazide (HYDRODIURIL) 12.5 MG tablet Take 12.5 mg by mouth every morning.     ketorolac (ACULAR) 0.5 % ophthalmic solution Apply to eye.     levothyroxine (SYNTHROID) 75 MCG tablet Take 75 mcg by mouth daily before breakfast.     levothyroxine (SYNTHROID) 88 MCG tablet Take by mouth.     metoprolol succinate (TOPROL-XL) 50 MG 24 hr tablet TAKE AS DIRECTED ONCE DAILY 90 DAYS     Misc Natural Products (OSTEO BI-FLEX JOINT SHIELD PO) Take 2 tablets by mouth daily.     Multiple  Vitamin (MULTIVITAMIN WITH MINERALS) TABS tablet Take 1 tablet by mouth daily.     naproxen (NAPROSYN) 500 MG tablet Take 500 mg by mouth 2 (two) times daily.     omeprazole (PRILOSEC) 40 MG capsule Take 40 mg by mouth daily.     omeprazole (PRILOSEC) 40 MG capsule Take by mouth.     Potassium 75 MG TABS      tiZANidine (ZANAFLEX) 4 MG tablet Take 1 tablet by mouth as needed. Once per day as needed     traMADol (ULTRAM) 50 MG tablet SMARTSIG:0.5-1 Tablet(s) By Mouth Every 8-12 Hours PRN     Vitamins/Minerals TABS Take by mouth.     No current facility-administered medications for this visit.     Allergies: No Known Allergies    Physical Exam: Blood pressure (!) 150/63, pulse 78, temperature 98.6 F (37 C), temperature source Oral, resp. rate 17, height '5\' 5"'$  (1.651 m), weight 154 lb 12.8 oz (70.2 kg).  ECOG: 1   General appearance: Comfortable appearing without any discomfort Head: Normocephalic without any trauma Oropharynx: Mucous membranes are moist and pink without any thrush or ulcers. Right parotid gland enlargement appears unchanged from previous examination. Eyes: Pupils are equal and round reactive to light. Lymph nodes: No cervical, supraclavicular, inguinal or axillary lymphadenopathy.   Heart:regular rate and rhythm.  S1 and S2 without leg edema. Lung: Clear without any rhonchi or wheezes.  No dullness to percussion. Abdomin: Soft, nontender, nondistended with good bowel sounds.  No hepatosplenomegaly. Musculoskeletal: No joint deformity or effusion.  Full range of motion noted. Neurological: No deficits noted on motor, sensory and deep tendon reflex exam. Skin: No petechial rash or dryness.  Appeared moist.     Lab Results: Lab Results  Component Value Date   WBC 7.2 09/06/2015   HGB 13.5 09/06/2015   HCT 42.1 09/06/2015   MCV 98.8 09/06/2015   PLT 281 09/06/2015     Chemistry      Component Value Date/Time   NA 140 05/25/2020 0801   K 4.2 05/25/2020  0801   CL 97 (L) 05/25/2020 0801   CO2 30 05/25/2020 0801   BUN 20 05/25/2020 0801   CREATININE 1.07 (H) 05/25/2020 0801      Component Value Date/Time   CALCIUM 9.4 05/25/2020 0801   ALKPHOS 45 05/25/2020 0801   AST 36 05/25/2020 0801   ALT 24 05/25/2020 0801   BILITOT 0.7 05/25/2020 0801       Radiological Studies:  IMPRESSION: 1. No evidence of metastatic disease in the chest. 2. Heterogeneously enhancing 1.7 cm renal cortical mass in the lateral interpolar right kidney, slightly increased in size since 09/10/2018 CT, compatible with primary renal cell carcinoma. 3. New mild aortocaval adenopathy, suspicious for metastatic disease. 4. Chronic findings include: Coronary atherosclerosis. Marked diffuse colonic diverticulosis. Aortic Atherosclerosis (ICD10-I70.0).  Impression and Plan:   81 year old with:  1.  Right kidney neoplasm diagnosed in 2017.  She was found to have 1.4 cm mass with a biopsy showed an oncocytic tumor.  She has been on active surveillance at this time without any indication for treatment or surgical removal.   CT scan obtained on March 2022 was personally reviewed and discussed again today with the patient.  The tumor has not dramatically changed over the last 5 years and does not appear to have malignant potential.  I do not recommend any systemic therapy at this time.  Active surveillance which she is currently undergoing with Cheryl Herman would be reasonable.   2.  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  This does not have any relation to her kidney tumor.  CT scan in March 2022 did not show any evidence of metastatic disease.  These tumors have an indolent nature and does not pose any risk of metastasis.  I recommended follow-up with Cheryl Herman if she has any problems in the future for possible surgical intervention for removal if there is any local symptoms.  3.  Follow-up: I'm happy to see her in the future as needed if any  other oncology issues arise.  30  minutes were spent on this encounter.  Time was dedicated to reviewing imaging studies, discussing differential diagnosis, management choices and future plan of care review.   Cheryl Button, MD 9/16/202210:30 AM

## 2020-12-08 DIAGNOSIS — G4733 Obstructive sleep apnea (adult) (pediatric): Secondary | ICD-10-CM | POA: Diagnosis not present

## 2020-12-22 DIAGNOSIS — N183 Chronic kidney disease, stage 3 unspecified: Secondary | ICD-10-CM | POA: Diagnosis not present

## 2020-12-22 DIAGNOSIS — Z23 Encounter for immunization: Secondary | ICD-10-CM | POA: Diagnosis not present

## 2020-12-22 DIAGNOSIS — I129 Hypertensive chronic kidney disease with stage 1 through stage 4 chronic kidney disease, or unspecified chronic kidney disease: Secondary | ICD-10-CM | POA: Diagnosis not present

## 2021-02-08 DIAGNOSIS — M461 Sacroiliitis, not elsewhere classified: Secondary | ICD-10-CM | POA: Diagnosis not present

## 2021-02-08 DIAGNOSIS — G894 Chronic pain syndrome: Secondary | ICD-10-CM | POA: Diagnosis not present

## 2021-02-16 DIAGNOSIS — Z1231 Encounter for screening mammogram for malignant neoplasm of breast: Secondary | ICD-10-CM | POA: Diagnosis not present

## 2021-02-21 ENCOUNTER — Emergency Department (HOSPITAL_BASED_OUTPATIENT_CLINIC_OR_DEPARTMENT_OTHER): Payer: Medicare Other

## 2021-02-21 ENCOUNTER — Encounter (HOSPITAL_BASED_OUTPATIENT_CLINIC_OR_DEPARTMENT_OTHER): Payer: Self-pay | Admitting: *Deleted

## 2021-02-21 ENCOUNTER — Emergency Department (HOSPITAL_BASED_OUTPATIENT_CLINIC_OR_DEPARTMENT_OTHER)
Admission: EM | Admit: 2021-02-21 | Discharge: 2021-02-21 | Disposition: A | Discharge status: Home / Self Care | Payer: Medicare Other | Attending: Emergency Medicine | Admitting: Emergency Medicine

## 2021-02-21 ENCOUNTER — Other Ambulatory Visit: Payer: Self-pay

## 2021-02-21 DIAGNOSIS — W01198A Fall on same level from slipping, tripping and stumbling with subsequent striking against other object, initial encounter: Secondary | ICD-10-CM | POA: Diagnosis not present

## 2021-02-21 DIAGNOSIS — I10 Essential (primary) hypertension: Secondary | ICD-10-CM | POA: Diagnosis not present

## 2021-02-21 DIAGNOSIS — Z85528 Personal history of other malignant neoplasm of kidney: Secondary | ICD-10-CM | POA: Insufficient documentation

## 2021-02-21 DIAGNOSIS — R109 Unspecified abdominal pain: Secondary | ICD-10-CM | POA: Diagnosis not present

## 2021-02-21 DIAGNOSIS — R1012 Left upper quadrant pain: Secondary | ICD-10-CM | POA: Insufficient documentation

## 2021-02-21 DIAGNOSIS — R1032 Left lower quadrant pain: Secondary | ICD-10-CM | POA: Diagnosis not present

## 2021-02-21 DIAGNOSIS — Z79899 Other long term (current) drug therapy: Secondary | ICD-10-CM | POA: Insufficient documentation

## 2021-02-21 DIAGNOSIS — W19XXXA Unspecified fall, initial encounter: Secondary | ICD-10-CM

## 2021-02-21 DIAGNOSIS — E039 Hypothyroidism, unspecified: Secondary | ICD-10-CM | POA: Insufficient documentation

## 2021-02-21 LAB — CBC WITH DIFFERENTIAL/PLATELET
Abs Immature Granulocytes: 0.02 10*3/uL (ref 0.00–0.07)
Basophils Absolute: 0 10*3/uL (ref 0.0–0.1)
Basophils Relative: 1 %
Eosinophils Absolute: 0.1 10*3/uL (ref 0.0–0.5)
Eosinophils Relative: 2 %
HCT: 41.4 % (ref 36.0–46.0)
Hemoglobin: 13.4 g/dL (ref 12.0–15.0)
Immature Granulocytes: 0 %
Lymphocytes Relative: 40 %
Lymphs Abs: 2.5 10*3/uL (ref 0.7–4.0)
MCH: 31.8 pg (ref 26.0–34.0)
MCHC: 32.4 g/dL (ref 30.0–36.0)
MCV: 98.3 fL (ref 80.0–100.0)
Monocytes Absolute: 0.6 10*3/uL (ref 0.1–1.0)
Monocytes Relative: 9 %
Neutro Abs: 3 10*3/uL (ref 1.7–7.7)
Neutrophils Relative %: 48 %
Platelets: 266 10*3/uL (ref 150–400)
RBC: 4.21 MIL/uL (ref 3.87–5.11)
RDW: 12.6 % (ref 11.5–15.5)
WBC: 6.2 10*3/uL (ref 4.0–10.5)
nRBC: 0 % (ref 0.0–0.2)

## 2021-02-21 LAB — COMPREHENSIVE METABOLIC PANEL
ALT: 21 U/L (ref 0–44)
AST: 35 U/L (ref 15–41)
Albumin: 4.2 g/dL (ref 3.5–5.0)
Alkaline Phosphatase: 39 U/L (ref 38–126)
Anion gap: 8 (ref 5–15)
BUN: 15 mg/dL (ref 8–23)
CO2: 32 mmol/L (ref 22–32)
Calcium: 9.3 mg/dL (ref 8.9–10.3)
Chloride: 99 mmol/L (ref 98–111)
Creatinine, Ser: 0.77 mg/dL (ref 0.44–1.00)
GFR, Estimated: >60 mL/min (ref >60)
Glucose, Bld: 92 mg/dL (ref 70–99)
Potassium: 3.6 mmol/L (ref 3.5–5.1)
Sodium: 139 mmol/L (ref 135–145)
Total Bilirubin: 0.7 mg/dL (ref 0.3–1.2)
Total Protein: 6.4 g/dL — ABNORMAL LOW (ref 6.5–8.1)

## 2021-02-21 MED ORDER — IOHEXOL 300 MG/ML  SOLN
100.0000 mL | Freq: Once | INTRAMUSCULAR | Status: AC | PRN
  Administered 2021-02-21: 10:00:00 100 mL via INTRAVENOUS

## 2021-02-21 MED ORDER — METHOCARBAMOL 500 MG PO TABS: 500.0000 mg | ORAL_TABLET | Freq: Two times a day (BID) | ORAL | 0 refills | Status: DC | PRN

## 2021-02-21 NOTE — Discharge Instructions (Addendum)
The CAT scan did not show any internal injury.  You still have the spot on the right kidney and continue to follow up with the oncologist.  Use the muscle relaxer as needed and tylenol/voltaren gel and heating pad.  Avoid any lifting and bending/twisting movement.  If you still have zanaflex at home do not take robaxin with the zanaflex

## 2021-02-21 NOTE — ED Provider Notes (Signed)
[facility] Provider Note   CSN: [id number] Arrival date & time: 02/21/21  3845     History Chief Complaint  Patient presents with   Lytle Michaels    [facility] is a 80 y.o. female.  Patient is an 80 year old female with a history of hypertension, hypothyroidism who is not on any anticoagulation presenting today with left-sided abdominal pain and hip pain that has been present since Wednesday which was 6 days prior to arrival.  Patient had been lying in her bathtub and got up on her elbow to grab her razor when her elbow slipped and she fell back into her bathtub more from a sitting height and hit her left side on the corner of the bathtub.  She reports on Thursday it was not too bad but then by yesterday it was feeling pretty severe.  It is a sharp deep pain all in the left side of her abdomen and her left hip.  She denies any radiation of the pain and she has no pain in her ribs or going down her leg.  She denies any shortness of breath.  She has had no nausea or vomiting.  She had taken tramadol which she has to use as needed for the last 2 days and had noted that her bowel movements were a little bit slower but she took something yesterday and had a normal bowel movement today.  She has not noted any blood in her bowel movements.  She has been able to urinate without difficulty and she has had no weakness.  She does report certain movements and standing and walking definitely feel worse.  The history is provided by the patient and a relative.  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 07/17/2019   Cystoid macular edema of right eye  07/17/2019    Past Surgical History:  Procedure Laterality Date   ABDOMINAL HYSTERECTOMY     partial   COLONOSCOPY WITH PROPOFOL N/A 10/27/2013   Procedure: COLONOSCOPY WITH PROPOFOL;  Surgeon: Garlan Fair, MD;  Location: [facility];  Service: Endoscopy;  Laterality: N/A;   ESOPHAGOGASTRODUODENOSCOPY (EGD) WITH PROPOFOL N/A 10/27/2013   Procedure: ESOPHAGOGASTRODUODENOSCOPY (EGD) WITH PROPOFOL;  Surgeon: Garlan Fair, MD;  Location: [facility];  Service: Endoscopy;  Laterality: N/A;   partial thryoidectomy  yrs ago     OB History   No obstetric history on file.     No family history on file.  Social History   Tobacco Use   Smoking status: Never   Smokeless tobacco: Never  Substance Use Topics   Alcohol use: No   Drug use: No    Home Medications Prior to Admission medications   Medication Sig Start Date End Date Taking? Authorizing Provider  methocarbamol (ROBAXIN) 500 MG tablet Take 1 tablet (500 mg total) by mouth 2 (two) times daily as needed for muscle spasms. 02/21/21  Yes Dontay Harm, Loree Fee, MD  atorvastatin (LIPITOR) 10 MG tablet Take 1 tablet by mouth every other day.    [provider]  B Complex-C (B-COMPLEX WITH VITAMIN C) tablet Take 1 tablet by mouth  daily.    [provider]  cholecalciferol (VITAMIN D) 1000 UNITS tablet Take 1,000 Units by mouth daily.    [provider]  diclofenac (VOLTAREN) 50 MG EC tablet Take 50 mg by mouth 2 (two) times daily. 09/20/20   [provider]  gabapentin (NEURONTIN) 100 MG capsule Take 100 mg by mouth every 8 (eight) hours. 07/28/20   [provider]  hydrochlorothiazide (HYDRODIURIL) 12.5 MG tablet Take 12.5 mg by mouth every morning. 05/19/19   [provider]  ketorolac (ACULAR) 0.5 % ophthalmic solution Apply to eye.    [provider]  levothyroxine (SYNTHROID) 75 MCG tablet Take 75 mcg by mouth daily before breakfast.    [provider]   levothyroxine (SYNTHROID) 88 MCG tablet Take by mouth.    [provider]  metoprolol succinate (TOPROL-XL) 50 MG 24 hr tablet TAKE AS DIRECTED ONCE DAILY 90 DAYS 06/19/19   [provider]  Misc Natural Products (OSTEO BI-FLEX JOINT SHIELD PO) Take 2 tablets by mouth daily.    [provider]  Multiple Vitamin (MULTIVITAMIN WITH MINERALS) TABS tablet Take 1 tablet by mouth daily.    [provider]  naproxen (NAPROSYN) 500 MG tablet Take 500 mg by mouth 2 (two) times daily. 06/02/20   [provider]  omeprazole (PRILOSEC) 40 MG capsule Take 40 mg by mouth daily.    [provider]  omeprazole (PRILOSEC) 40 MG capsule Take by mouth.    [provider]  Potassium 75 MG TABS  03/14/19   [provider]  tiZANidine (ZANAFLEX) 4 MG tablet Take 1 tablet by mouth as needed. Once per day as needed 05/10/20   [provider]  traMADol (ULTRAM) 50 MG tablet SMARTSIG:0.5-1 Tablet(s) By Mouth Every 8-12 Hours PRN 07/28/20   [provider]  Vitamins/Minerals TABS Take by mouth.    [provider]    Allergies    Patient has no known allergies.  Review of Systems   Review of Systems  All other systems reviewed and are negative.  Physical Exam Updated Vital Signs BP (!) 144/80    Pulse 66    Temp (!) 97.5 F (36.4 C) (Oral)    Resp 12    Ht 5\' 5"  (1.651 m)    Wt 70.3 kg    SpO2 97%    BMI 25.79 kg/m   Physical Exam Vitals and nursing note reviewed.  Constitutional:      General: She is not in acute distress.    Appearance: She is well-developed.  HENT:     Head: Normocephalic and atraumatic.  Eyes:     Pupils: Pupils are equal, round, and reactive to light.  Cardiovascular:     Rate and Rhythm: Normal rate and regular rhythm.     Heart sounds: Normal heart sounds. No murmur heard.   No friction rub.  Pulmonary:     Effort: Pulmonary effort is normal.     Breath sounds: Normal breath sounds. No  wheezing or rales.  Abdominal:     General: Bowel sounds are normal. There is no distension.     Palpations: Abdomen is soft.     Tenderness: There is abdominal tenderness in the suprapubic area, left upper quadrant and left lower quadrant. There is left CVA tenderness and guarding. There is no rebound.     Comments: No bruising or rashes noted to the abdomen or back  Musculoskeletal:        General: No tenderness.  Normal range of motion.     Right lower leg: No edema.     Left lower leg: No edema.     Comments: No edema.  No tenderness with axial loading on the left leg but tenderness with left hip internal and external rotation.  No significant tenderness with hip flexion.  Skin:    General: Skin is warm and dry.     Findings: No rash.  Neurological:     Mental Status: She is alert and oriented to person, place, and time. Mental status is at baseline.     Cranial Nerves: No cranial nerve deficit.     Sensory: No sensory deficit.     Motor: No weakness.  Psychiatric:        Behavior: Behavior normal.    ED Results / Procedures / Treatments   Labs (all labs ordered are listed, but only abnormal results are displayed) Labs Reviewed  COMPREHENSIVE METABOLIC PANEL - Abnormal; Notable for the following components:      Result Value   Total Protein 6.4 (*)    All other components within normal limits  CBC WITH DIFFERENTIAL/PLATELET    EKG None  Radiology CT ABDOMEN PELVIS W CONTRAST  Result Date: 02/21/2021 CLINICAL DATA:  Blunt abdominal trauma, fell in bathtub on Wednesday, LEFT hip and lower abdominal pain EXAM: CT ABDOMEN AND PELVIS WITH CONTRAST TECHNIQUE: Multidetector CT imaging of the abdomen and pelvis was performed using the standard protocol following bolus administration of intravenous contrast. CONTRAST:  182mL OMNIPAQUE IOHEXOL 300 MG/ML SOLN IV. No oral contrast. COMPARISON:  05/25/2020 FINDINGS: Lower chest: Minimal dependent atelectasis at base of RIGHT lower lobe  Hepatobiliary: Tiny nonspecific low-attenuation focus at dome of liver 4 mm image 7. 15 mm suspected hemangioma adjacent to gallbladder fossa and RIGHT lobe liver unchanged. Questionable focal gallbladder wall thickening 9 x 5 x 11 mm versus related to adjacent hepatic hemangioma, gallbladder mass not excluded. Remainder of liver unremarkable. No biliary dilatation. Pancreas: Normal Spleen: Normal Adrenals/Urinary Tract: Adrenal glands and LEFT kidney normal. Enhancing mass laterally mid RIGHT kidney 17 x 16 x 15 mm highly worrisome for renal neoplasm. Additional tiny cyst inferior RIGHT kidney. No urinary tract calcification or dilatation. Ureters and bladder unremarkable. Stomach/Bowel: Appendix not identified. Diverticulosis of distal transverse through sigmoid colon without evidence of diverticulitis. Stomach and bowel loops otherwise normal appearance. Vascular/Lymphatic: Atherosclerotic calcifications aorta and iliac arteries without aneurysm. No adenopathy. Reproductive: Unremarkable prostate gland and seminal vesicles Other: Small BILATERAL inguinal hernias containing fat. No free air or free fluid. No intra-abdominal injury identified. Musculoskeletal: Res coli oasis lumbar spine. Degenerative disc disease changes L1-L2 and L4-L5. Facet degenerative changes lower lumbar spine. IMPRESSION: No acute intra-abdominal or intrapelvic injuries identified. Enhancing mass laterally mid RIGHT kidney 17 x 16 x 15 mm consistent with renal neoplasm. Distal colonic diverticulosis without evidence of diverticulitis. Small BILATERAL inguinal hernias containing fat. Small hepatic hemangioma. Questionable focal gallbladder wall thickening/mass as above, 9 x 5 x 11 mm; ultrasound evaluation recommended to exclude tumor. Aortic Atherosclerosis (ICD10-I70.0). Electronically Signed   By: Lavonia Dana M.D.   On: 02/21/2021 10:20    Procedures Procedures   Medications Ordered in ED Medications  iohexol (OMNIPAQUE) 300 MG/ML  solution 100 mL (100 mLs Intravenous Contrast Given 02/21/21 0955)    ED Course  I have reviewed the triage vital signs and the nursing notes.  Pertinent labs & imaging results that were available during my care of the patient were reviewed by me and  considered in my medical decision making (see chart for details).    MDM Rules/Calculators/A&P                         Patient presenting today with severe and worsening left-sided abdominal and hip pain.  This occurred after a fall onto it 6 days ago.  She has no rib pain or shortness of breath.  She has no external signs of trauma.  She does not take any anticoagulation but does have pain with certain hip movements as well as palpation of the abdomen.  Concern for possible acute internal injury of the abdomen, pelvic fracture, possible back pathology or MSK pain.  Lower suspicion for diverticulitis or UTI.  10:48 AM CT does not show any evidence of new traumatic injury.  It did show a mass on the right kidney however when discussing with the patient she reports for the last 3 years she is numbness was there.  She had a biopsy that showed that it was not malignant and she continues to follow with oncology.  She will continue supportive care at home.  Most likely musculoskeletal in nature.  Her labs are reassuring with a normal CBC and CMP without evidence of acute abnormality.  MDM   Amount and/or Complexity of Data Reviewed Clinical lab tests: ordered and reviewed Tests in the radiology section of CPT: ordered and reviewed Review and summarize past medical records: yes Independent visualization of images, tracings, or specimens: yes        Final Clinical Impression(s) / ED Diagnoses Final diagnoses:  Fall, initial encounter  Left sided abdominal pain    Rx / DC Orders ED Discharge Orders          Ordered    methocarbamol (ROBAXIN) 500 MG tablet  2 times daily PRN        02/21/21 1047             Blanchie Dessert,  MD 02/21/21 1049

## 2021-02-21 NOTE — ED Triage Notes (Addendum)
Pt was reaching for something in bathtub in squatting position and fell backward, event occurred on Wednesday, pain in left hip, lower abd area. No LOC or head injury

## 2021-03-09 DIAGNOSIS — G4733 Obstructive sleep apnea (adult) (pediatric): Secondary | ICD-10-CM | POA: Diagnosis not present

## 2021-03-14 DIAGNOSIS — M461 Sacroiliitis, not elsewhere classified: Secondary | ICD-10-CM | POA: Diagnosis not present

## 2021-04-05 DIAGNOSIS — Z961 Presence of intraocular lens: Secondary | ICD-10-CM | POA: Diagnosis not present

## 2021-04-05 DIAGNOSIS — H31091 Other chorioretinal scars, right eye: Secondary | ICD-10-CM | POA: Diagnosis not present

## 2021-04-05 DIAGNOSIS — H02831 Dermatochalasis of right upper eyelid: Secondary | ICD-10-CM | POA: Diagnosis not present

## 2021-04-05 DIAGNOSIS — H353111 Nonexudative age-related macular degeneration, right eye, early dry stage: Secondary | ICD-10-CM | POA: Diagnosis not present

## 2021-04-05 DIAGNOSIS — H02834 Dermatochalasis of left upper eyelid: Secondary | ICD-10-CM | POA: Diagnosis not present

## 2021-04-05 DIAGNOSIS — H04123 Dry eye syndrome of bilateral lacrimal glands: Secondary | ICD-10-CM | POA: Diagnosis not present

## 2021-04-05 DIAGNOSIS — H348112 Central retinal vein occlusion, right eye, stable: Secondary | ICD-10-CM | POA: Diagnosis not present

## 2021-04-06 DIAGNOSIS — I10 Essential (primary) hypertension: Secondary | ICD-10-CM | POA: Diagnosis not present

## 2021-04-06 DIAGNOSIS — M461 Sacroiliitis, not elsewhere classified: Secondary | ICD-10-CM | POA: Diagnosis not present

## 2021-04-26 ENCOUNTER — Other Ambulatory Visit: Payer: Self-pay

## 2021-04-26 ENCOUNTER — Encounter (INDEPENDENT_AMBULATORY_CARE_PROVIDER_SITE_OTHER): Payer: Self-pay | Admitting: Ophthalmology

## 2021-04-26 ENCOUNTER — Encounter (INDEPENDENT_AMBULATORY_CARE_PROVIDER_SITE_OTHER): Payer: Medicare Other | Admitting: Ophthalmology

## 2021-04-26 ENCOUNTER — Ambulatory Visit (INDEPENDENT_AMBULATORY_CARE_PROVIDER_SITE_OTHER): Payer: Medicare Other | Admitting: Ophthalmology

## 2021-04-26 DIAGNOSIS — H35351 Cystoid macular degeneration, right eye: Secondary | ICD-10-CM | POA: Diagnosis not present

## 2021-04-26 DIAGNOSIS — H18231 Secondary corneal edema, right eye: Secondary | ICD-10-CM | POA: Diagnosis not present

## 2021-04-26 DIAGNOSIS — H3321 Serous retinal detachment, right eye: Secondary | ICD-10-CM

## 2021-04-26 DIAGNOSIS — H35071 Retinal telangiectasis, right eye: Secondary | ICD-10-CM | POA: Diagnosis not present

## 2021-04-26 DIAGNOSIS — Z9889 Other specified postprocedural states: Secondary | ICD-10-CM

## 2021-04-26 NOTE — Assessment & Plan Note (Signed)
Stable, no new breaks retina attached

## 2021-04-26 NOTE — Patient Instructions (Signed)
Patient to schedule consultative appointment at [facility] eye care with Dr. Annia Belt, cornea specialist

## 2021-04-26 NOTE — Assessment & Plan Note (Signed)
OD subtle finding today is associated with 3+ pigmentary change and accumulation on the endothelium, with the diffraction of the projected slit beam suggesting some early opacification

## 2021-04-26 NOTE — Assessment & Plan Note (Signed)
On CPAP therapy since November 2018 and OCT's from that time compared to today 2023, still no residual or recurrent CME and off of topical medical therapy (NSAIDs)

## 2021-04-26 NOTE — Assessment & Plan Note (Signed)
History of complex retinal detachment repair, no new findings OD,

## 2021-04-26 NOTE — Progress Notes (Signed)
04/26/2021     CHIEF COMPLAINT Patient presents for  Chief Complaint  Patient presents with   Cystoid Macular Edema      HISTORY OF PRESENT ILLNESS: [facility] is a 81 y.o. female who presents to the clinic today for:   HPI   1 yr fu ou oct fp. Pt states "about a month ago I noticed a little cloud in my vision in my right eye." Denies new FOL or floaters. Pt states her left eye tends to runny. Pt states she is not using any prescription eye drops. Last edited by Laurin Coder on 04/26/2021  8:49 AM.      Referring physician: Clent Jacks, MD [address] [place],  [place] [address]  HISTORICAL INFORMATION:   Selected notes from the MEDICAL RECORD NUMBER       CURRENT MEDICATIONS: Current Outpatient Medications (Ophthalmic Drugs)  Medication Sig   ketorolac (ACULAR) 0.5 % ophthalmic solution Apply to eye.   No current facility-administered medications for this visit. (Ophthalmic Drugs)   Current Outpatient Medications (Other)  Medication Sig   atorvastatin (LIPITOR) 10 MG tablet Take 1 tablet by mouth every other day.   B Complex-C (B-COMPLEX WITH VITAMIN C) tablet Take 1 tablet by mouth daily.   cholecalciferol (VITAMIN D) 1000 UNITS tablet Take 1,000 Units by mouth daily.   diclofenac (VOLTAREN) 50 MG EC tablet Take 50 mg by mouth 2 (two) times daily.   gabapentin (NEURONTIN) 100 MG capsule Take 100 mg by mouth every 8 (eight) hours.   hydrochlorothiazide (HYDRODIURIL) 12.5 MG tablet Take 12.5 mg by mouth every morning.   levothyroxine (SYNTHROID) 75 MCG tablet Take 75 mcg by mouth daily before breakfast.   levothyroxine (SYNTHROID) 88 MCG tablet Take by mouth.   methocarbamol (ROBAXIN) 500 MG tablet Take 1 tablet (500 mg total) by mouth 2 (two) times daily as needed for muscle spasms.   metoprolol succinate (TOPROL-XL) 50 MG 24 hr tablet TAKE AS DIRECTED ONCE DAILY 90 DAYS   Misc Natural Products (OSTEO BI-FLEX JOINT SHIELD PO) Take 2 tablets by  mouth daily.   Multiple Vitamin (MULTIVITAMIN WITH MINERALS) TABS tablet Take 1 tablet by mouth daily.   naproxen (NAPROSYN) 500 MG tablet Take 500 mg by mouth 2 (two) times daily.   omeprazole (PRILOSEC) 40 MG capsule Take 40 mg by mouth daily.   omeprazole (PRILOSEC) 40 MG capsule Take by mouth.   Potassium 75 MG TABS    tiZANidine (ZANAFLEX) 4 MG tablet Take 1 tablet by mouth as needed. Once per day as needed   traMADol (ULTRAM) 50 MG tablet SMARTSIG:0.5-1 Tablet(s) By Mouth Every 8-12 Hours PRN   Vitamins/Minerals TABS Take by mouth.   No current facility-administered medications for this visit. (Other)      REVIEW OF SYSTEMS: ROS   Negative for: Constitutional, Gastrointestinal, Neurological, Skin, Genitourinary, Musculoskeletal, HENT, Endocrine, Cardiovascular, Eyes, Respiratory, Psychiatric, Allergic/Imm, Heme/Lymph Last edited by Hurman Horn, MD on 04/26/2021  9:23 AM.       ALLERGIES No Known Allergies  PAST MEDICAL HISTORY Past Medical History:  Diagnosis Date   Arthritis    oa   GERD (gastroesophageal reflux disease)    Hypertension    Hypothyroidism    Past Surgical History:  Procedure Laterality Date   ABDOMINAL HYSTERECTOMY     partial   COLONOSCOPY WITH PROPOFOL N/A 10/27/2013   Procedure: COLONOSCOPY WITH PROPOFOL;  Surgeon: Garlan Fair, MD;  Location: [facility];  Service:  Endoscopy;  Laterality: N/A;   ESOPHAGOGASTRODUODENOSCOPY (EGD) WITH PROPOFOL N/A 10/27/2013   Procedure: ESOPHAGOGASTRODUODENOSCOPY (EGD) WITH PROPOFOL;  Surgeon: Garlan Fair, MD;  Location: [facility];  Service: Endoscopy;  Laterality: N/A;   partial thryoidectomy  yrs ago    FAMILY HISTORY History reviewed. No pertinent family history.  SOCIAL HISTORY Social History   Tobacco Use   Smoking status: Never   Smokeless tobacco: Never  Substance Use Topics   Alcohol use: No   Drug use: No         OPHTHALMIC EXAM:  Base Eye Exam     Visual Acuity (ETDRS)        Right Left   Dist cc 20/200 -1 20/20   Dist ph cc NI     Correction: Glasses         Tonometry (Tonopen, 8:51 AM)       Right Left   Pressure 20 14         Pupils       Pupils Dark Light Shape React APD   Right PERRL 4 4 Round Minimal None   Left PERRL 4 3 Round Brisk None         Visual Fields (Counting fingers)       Left Right    Full    Restrictions  Partial outer superior temporal deficiency         Extraocular Movement       Right Left    Full Full         Neuro/Psych     Oriented x3: Yes   Mood/Affect: Normal         Dilation     Both eyes: 1.0% Mydriacyl, 2.5% Phenylephrine @ 8:51 AM           Slit Lamp and Fundus Exam     External Exam       Right Left   External Normal Normal         Slit Lamp Exam       Right Left   Lids/Lashes Normal Normal   Conjunctiva/Sclera White and quiet White and quiet   Cornea 2-3+ corneal endothelial pigment, mild diffuse haze to the cornea may be thickening Clear   Anterior Chamber Deep and quiet Deep and quiet   Iris Round and reactive Round and reactive   Lens Posterior chamber intraocular lens, clear visual axis Posterior chamber intraocular lens clear visual axis   Anterior Vitreous Normal Normal         Fundus Exam       Right Left   Posterior Vitreous Clear, vitrectomized Posterior vitreous detachment   Disc 1+ Pallor Normal   C/D Ratio 0.55 0.35   Macula Attached, no macular thickening, no microaneurysms, Retinal pigment epithelial mottling Normal   Vessels Normal Normal   Periphery Good retinopexy peripherally.  Retina attached. Attached 360 Normal, attached 360 no holes             IMAGING AND PROCEDURES  Imaging and Procedures for 04/26/21  OCT, Retina - OU - Both Eyes       Right Eye Quality was good. Scan locations included subfoveal. Central Foveal Thickness: 260. Progression has been stable. Findings include abnormal foveal contour.   Left  Eye Quality was good. Scan locations included subfoveal. Central Foveal Thickness: 247. Progression has been stable. Findings include normal foveal contour.   Notes OD macular anatomy has vastly improved since onset of use continually of CPAP, November 2018.  This  is concomitant with the cessation of chronic topical NSAIDs which had no impact on the anatomy OD.  Yet diffuse atrophy likely from prior RD.  OS normal, incidental posterior vitreous detachment     Color Fundus Photography Optos - OU - Both Eyes       Right Eye Progression has been stable. Disc findings include pallor.   Left Eye Progression has been stable. Disc findings include normal observations. Macula : normal observations. Vessels : normal observations. Periphery : normal observations.   Notes OD, chorioretinal scarring extensive Inferior  270 secondary to prior complex retinal detachment and repair.  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 Cystoid macular edema of right eye  H35.351 OCT, Retina - OU - Both Eyes    Color Fundus Photography Optos - OU - Both Eyes    2. History of vitrectomy  Z98.890     3. Retinal telangiectasia of right eye  H35.071     4. 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  OD, OS, no new retinal tears or  breaks looks great  2.  OD complaints of diffuse haze upon the vision may correspond today with my subtle findings found on corneal inspection whereby the projected slit beam and the cornea is slightly deflected suggesting early opacification.  Coincidentally with 3-4+ pigment endothelial deposition  3.  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  Since that time CME in the macular region of the right eye has completely resolved and been maintained.  Diffuse atrophy remains which contributes somewhat to the visual acuity but not the new symptoms  Ophthalmic Meds Ordered this visit:  No orders of the defined types were placed in this encounter.      Return in about 1 year (around 04/26/2022) for DILATE OU, OCT.  Patient Instructions  Patient to schedule consultative appointment at [facility] eye care with Dr. Annia Belt, cornea specialist   Explained the diagnoses, plan, and follow up with the patient and they expressed understanding.  Patient expressed understanding of the importance of proper follow up care.   Clent Demark Anabell Swint M.D. Diseases & Surgery of the Retina and Vitreous [facility] [place] 04/26/21     Abbreviations: M myopia (nearsighted); A astigmatism; H hyperopia (farsighted); P presbyopia; Mrx spectacle prescription;  CTL contact lenses; OD right eye; OS left eye; OU both eyes  XT exotropia; ET esotropia; PEK punctate epithelial keratitis; PEE punctate epithelial erosions; DES dry eye syndrome; MGD meibomian gland dysfunction; ATs artificial tears; PFAT's preservative free artificial tears; [place] nuclear sclerotic cataract; PSC posterior subcapsular cataract; ERM epi-retinal membrane; PVD posterior  vitreous detachment; RD retinal detachment; DM diabetes mellitus; DR diabetic retinopathy; NPDR non-proliferative  diabetic retinopathy; PDR proliferative diabetic retinopathy; CSME clinically significant macular edema; DME diabetic macular edema; dbh dot blot hemorrhages; CWS cotton wool spot; POAG primary open angle glaucoma; C/D cup-to-disc ratio; HVF humphrey visual field; GVF goldmann visual field; OCT optical coherence tomography; IOP intraocular pressure; BRVO Branch retinal vein occlusion; CRVO central retinal vein occlusion; CRAO central retinal artery occlusion; BRAO branch retinal artery occlusion; RT retinal tear; SB scleral buckle; PPV pars plana vitrectomy; VH Vitreous hemorrhage; PRP panretinal laser photocoagulation; IVK intravitreal kenalog; VMT vitreomacular traction; MH Macular hole;  NVD neovascularization of the disc; NVE neovascularization elsewhere; AREDS age related eye disease study; ARMD age related macular degeneration; POAG primary open angle glaucoma; EBMD epithelial/anterior basement membrane dystrophy; ACIOL anterior chamber intraocular lens; IOL intraocular lens; PCIOL posterior chamber intraocular lens; Phaco/IOL phacoemulsification with intraocular lens placement; [place] photorefractive keratectomy; LASIK laser assisted in situ keratomileusis; HTN hypertension; DM diabetes mellitus; COPD chronic obstructive pulmonary disease

## 2021-04-27 DIAGNOSIS — E039 Hypothyroidism, unspecified: Secondary | ICD-10-CM | POA: Diagnosis not present

## 2021-04-27 DIAGNOSIS — G4733 Obstructive sleep apnea (adult) (pediatric): Secondary | ICD-10-CM | POA: Diagnosis not present

## 2021-04-27 DIAGNOSIS — K219 Gastro-esophageal reflux disease without esophagitis: Secondary | ICD-10-CM | POA: Diagnosis not present

## 2021-04-27 DIAGNOSIS — Z0000 Encounter for general adult medical examination without abnormal findings: Secondary | ICD-10-CM | POA: Diagnosis not present

## 2021-04-27 DIAGNOSIS — K589 Irritable bowel syndrome without diarrhea: Secondary | ICD-10-CM | POA: Diagnosis not present

## 2021-04-27 DIAGNOSIS — G629 Polyneuropathy, unspecified: Secondary | ICD-10-CM | POA: Diagnosis not present

## 2021-04-27 DIAGNOSIS — E7800 Pure hypercholesterolemia, unspecified: Secondary | ICD-10-CM | POA: Diagnosis not present

## 2021-04-27 DIAGNOSIS — I129 Hypertensive chronic kidney disease with stage 1 through stage 4 chronic kidney disease, or unspecified chronic kidney disease: Secondary | ICD-10-CM | POA: Diagnosis not present

## 2021-04-27 DIAGNOSIS — J309 Allergic rhinitis, unspecified: Secondary | ICD-10-CM | POA: Diagnosis not present

## 2021-04-27 DIAGNOSIS — M1990 Unspecified osteoarthritis, unspecified site: Secondary | ICD-10-CM | POA: Diagnosis not present

## 2021-04-27 DIAGNOSIS — Z1389 Encounter for screening for other disorder: Secondary | ICD-10-CM | POA: Diagnosis not present

## 2021-06-13 DIAGNOSIS — M461 Sacroiliitis, not elsewhere classified: Secondary | ICD-10-CM | POA: Diagnosis not present

## 2021-06-24 DIAGNOSIS — G4733 Obstructive sleep apnea (adult) (pediatric): Secondary | ICD-10-CM | POA: Diagnosis not present

## 2021-07-04 DIAGNOSIS — H02831 Dermatochalasis of right upper eyelid: Secondary | ICD-10-CM | POA: Diagnosis not present

## 2021-07-04 DIAGNOSIS — H02834 Dermatochalasis of left upper eyelid: Secondary | ICD-10-CM | POA: Diagnosis not present

## 2021-07-04 DIAGNOSIS — Z961 Presence of intraocular lens: Secondary | ICD-10-CM | POA: Diagnosis not present

## 2021-07-04 DIAGNOSIS — H348112 Central retinal vein occlusion, right eye, stable: Secondary | ICD-10-CM | POA: Diagnosis not present

## 2021-07-04 DIAGNOSIS — H31091 Other chorioretinal scars, right eye: Secondary | ICD-10-CM | POA: Diagnosis not present

## 2021-07-04 DIAGNOSIS — H21501 Unspecified adhesions of iris, right eye: Secondary | ICD-10-CM | POA: Diagnosis not present

## 2021-07-04 DIAGNOSIS — H04123 Dry eye syndrome of bilateral lacrimal glands: Secondary | ICD-10-CM | POA: Diagnosis not present

## 2021-07-06 DIAGNOSIS — G4733 Obstructive sleep apnea (adult) (pediatric): Secondary | ICD-10-CM | POA: Diagnosis not present

## 2021-07-27 DIAGNOSIS — M461 Sacroiliitis, not elsewhere classified: Secondary | ICD-10-CM | POA: Diagnosis not present

## 2021-07-27 DIAGNOSIS — G894 Chronic pain syndrome: Secondary | ICD-10-CM | POA: Diagnosis not present

## 2021-07-27 DIAGNOSIS — M48061 Spinal stenosis, lumbar region without neurogenic claudication: Secondary | ICD-10-CM | POA: Diagnosis not present

## 2021-08-02 ENCOUNTER — Encounter (INDEPENDENT_AMBULATORY_CARE_PROVIDER_SITE_OTHER): Payer: Self-pay

## 2021-08-02 DIAGNOSIS — D487 Neoplasm of uncertain behavior of other specified sites: Secondary | ICD-10-CM | POA: Diagnosis not present

## 2021-08-12 DIAGNOSIS — M545 Low back pain, unspecified: Secondary | ICD-10-CM | POA: Diagnosis not present

## 2021-08-15 DIAGNOSIS — M545 Low back pain, unspecified: Secondary | ICD-10-CM | POA: Diagnosis not present

## 2021-08-19 DIAGNOSIS — M545 Low back pain, unspecified: Secondary | ICD-10-CM | POA: Diagnosis not present

## 2021-08-23 DIAGNOSIS — M545 Low back pain, unspecified: Secondary | ICD-10-CM | POA: Diagnosis not present

## 2021-08-25 DIAGNOSIS — M545 Low back pain, unspecified: Secondary | ICD-10-CM | POA: Diagnosis not present

## 2021-09-06 DIAGNOSIS — M545 Low back pain, unspecified: Secondary | ICD-10-CM | POA: Diagnosis not present

## 2021-09-08 DIAGNOSIS — M545 Low back pain, unspecified: Secondary | ICD-10-CM | POA: Diagnosis not present

## 2021-09-12 DIAGNOSIS — M545 Low back pain, unspecified: Secondary | ICD-10-CM | POA: Diagnosis not present

## 2021-09-14 DIAGNOSIS — M545 Low back pain, unspecified: Secondary | ICD-10-CM | POA: Diagnosis not present

## 2021-09-19 DIAGNOSIS — M545 Low back pain, unspecified: Secondary | ICD-10-CM | POA: Diagnosis not present

## 2021-09-22 DIAGNOSIS — M545 Low back pain, unspecified: Secondary | ICD-10-CM | POA: Diagnosis not present

## 2021-09-26 DIAGNOSIS — M461 Sacroiliitis, not elsewhere classified: Secondary | ICD-10-CM | POA: Diagnosis not present

## 2021-09-26 DIAGNOSIS — M545 Low back pain, unspecified: Secondary | ICD-10-CM | POA: Diagnosis not present

## 2021-09-28 IMAGING — CT CT NECK W/ CM
4 of 5 series · 14 of 33 positions shown, 16 images · IV contrast (OMNIPAQUE)
Comparison: Neck ultrasound 04/12/2020

CLINICAL DATA: Right neck mass, recently biopsied with pathology
demonstrating oncocytic neoplasm. History of renal cell carcinoma.

EXAM:
CT NECK WITH CONTRAST
TECHNIQUE: Multidetector CT imaging of the neck was performed using the
standard protocol following the bolus administration of intravenous
contrast.
CONTRAST:  100mL OMNIPAQUE IOHEXOL 300 MG/ML  SOLN

[Series 4: axial bone · axial · 0.48mm/px · z∈[-215,-83]mm · 3 of 134 slices shown, 4 images]
[im 34/134  soft-tissue]
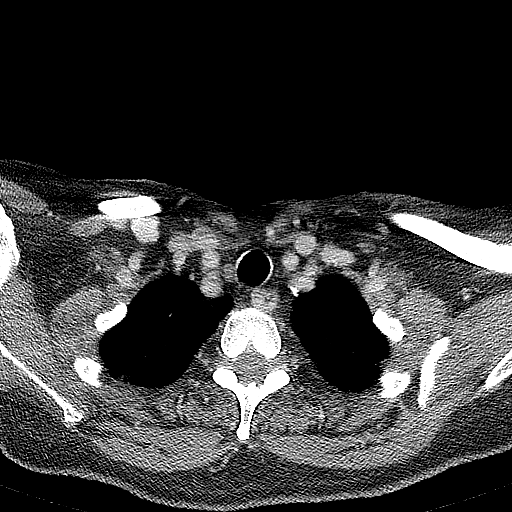
[im 34/134  bone]
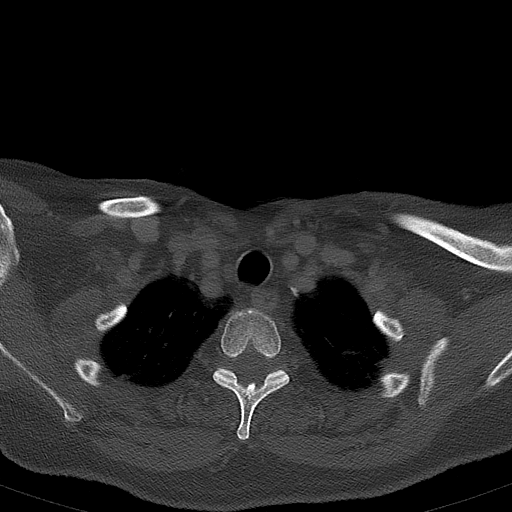
[im 67/134  bone]
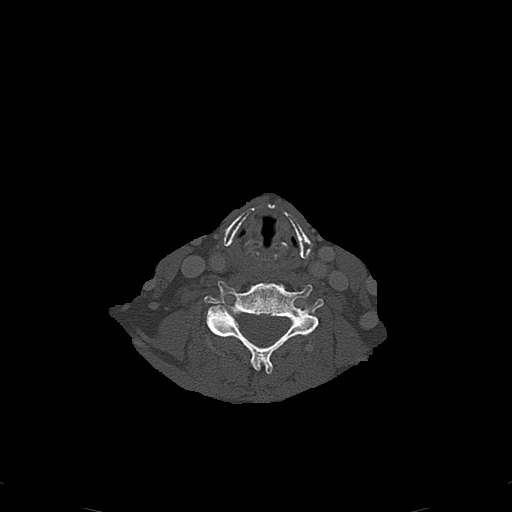
[im 100/134  bone]
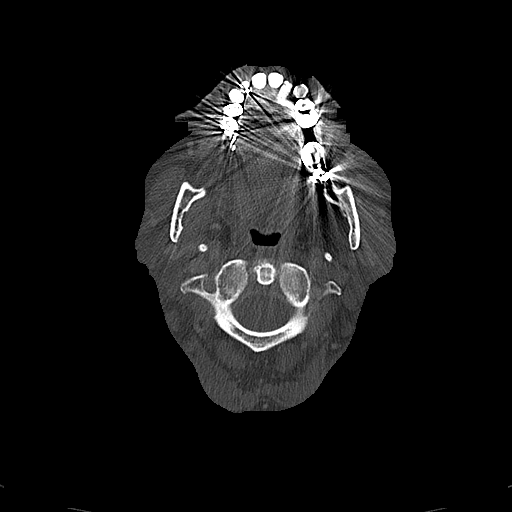

[Series 5: orthogonal (person_name) · axial · 0.43mm/px · z∈[-216,-84]mm · 3 of 134 slices shown]
[im 34/134  bone]
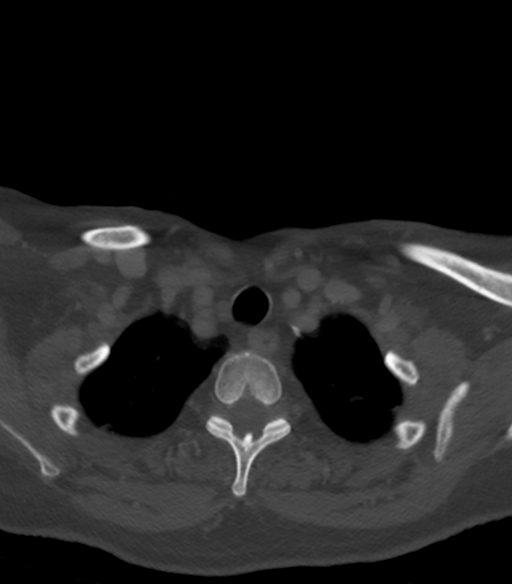
[im 67/134  bone]
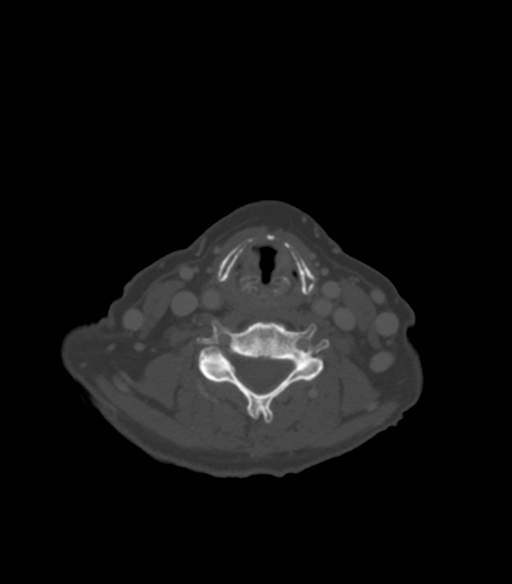
[im 100/134  bone]
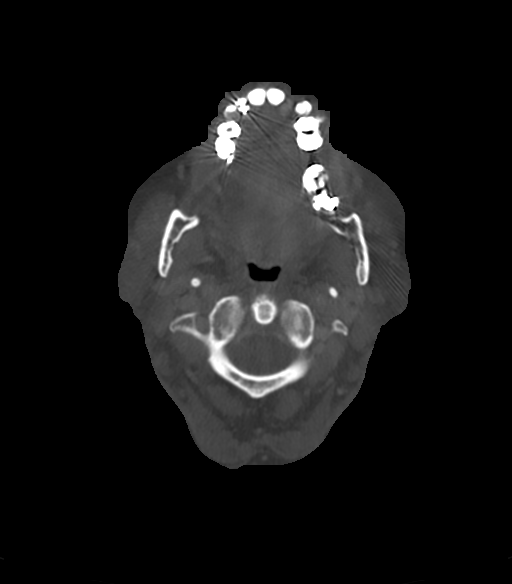

[Series 6: cor neck · coronal · 0.52mm/px · 3 of 114 slices shown]
[im 23/114  bone]
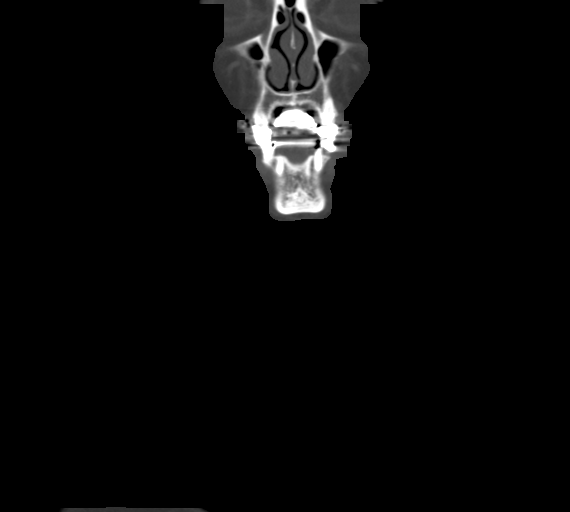
[im 46/114  bone]
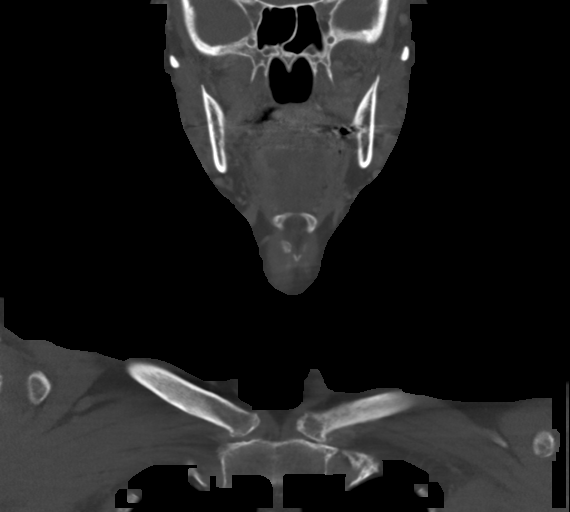
[im 68/114  bone]
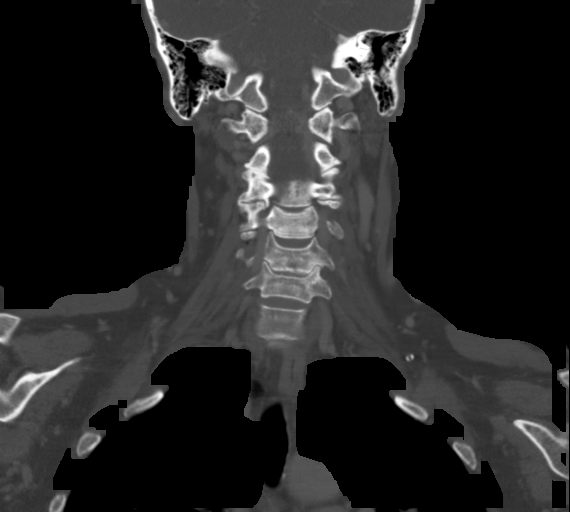

[Series 7: sag neck · sagittal · 0.47mm/px · 5 of 114 slices shown, 6 images]
[im 38/114  bone]
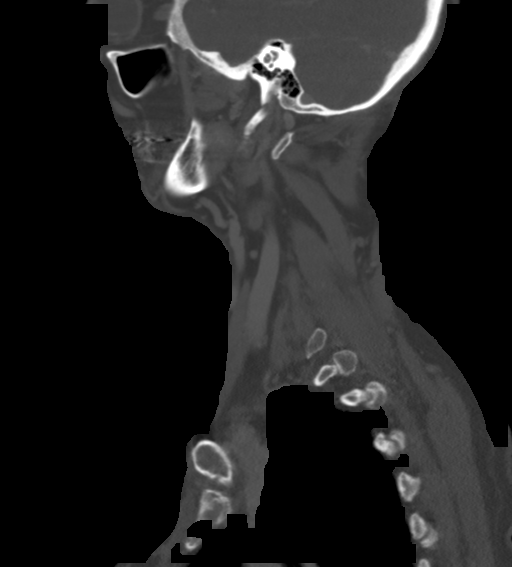
[im 48/114  bone]
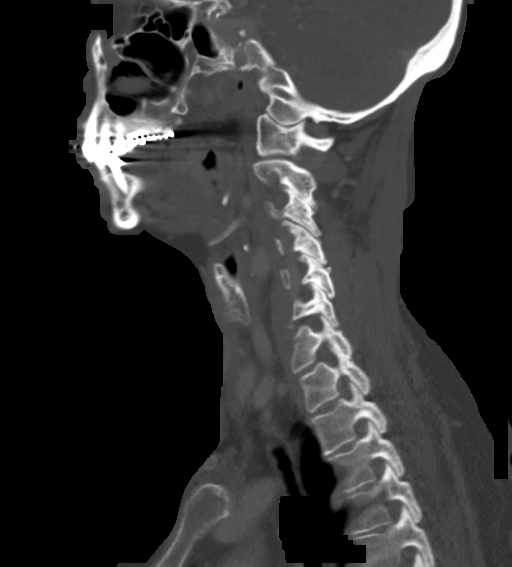
[im 57/114  soft-tissue]
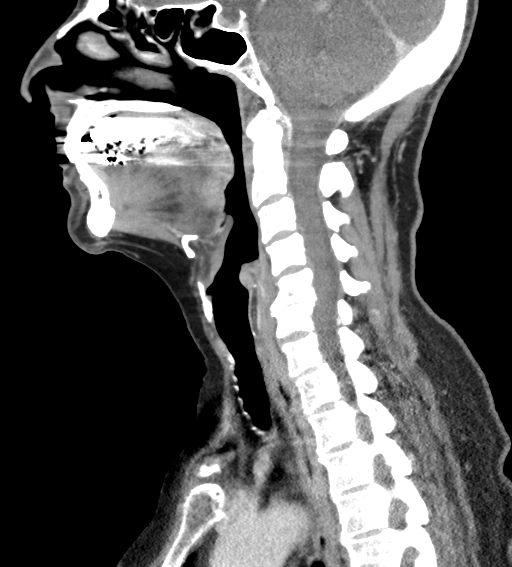
[im 57/114  bone]
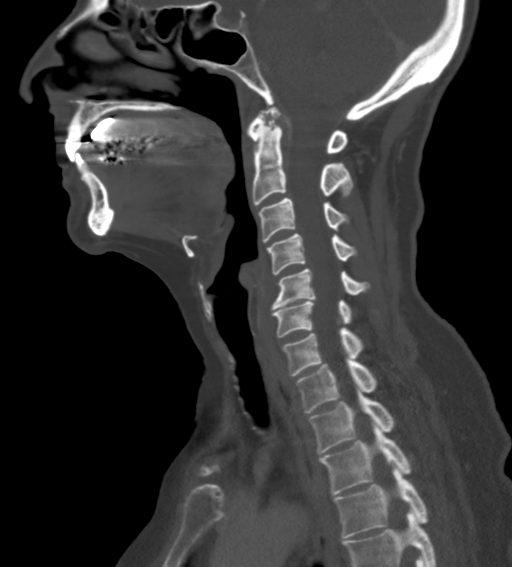
[im 66/114  bone]
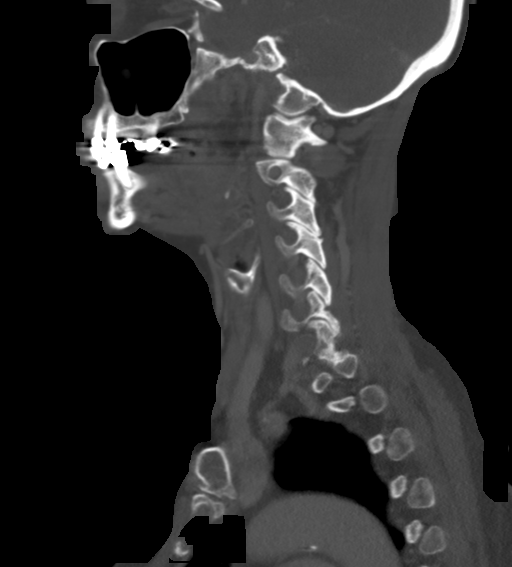
[im 76/114  bone]
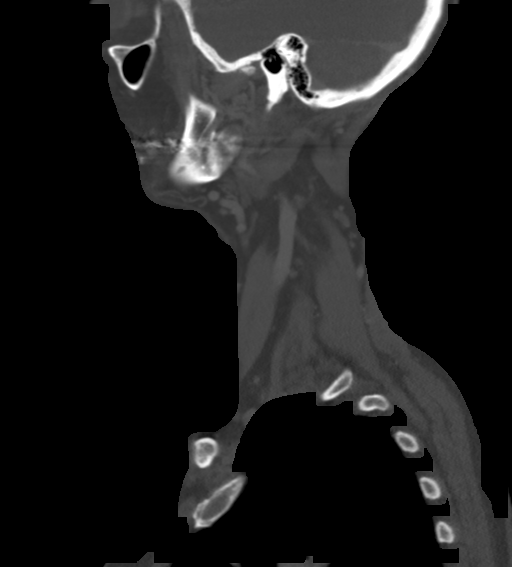

[14 of 33 positions shown; findings below may reference images not displayed]

FINDINGS: Pharynx and larynx: No evidence of mass or swelling.

Salivary glands: A homogeneously enhancing mass along the
posteroinferior aspect of the right parotid tail measures 14 x 16 x
28 mm, similar in size to the prior ultrasound (with only the short
axis measurement included in that report). There is also a 10 mm
homogeneously enhancing nodule in the left parotid gland which is
similar in size to the prior ultrasound. The submandibular glands
are unremarkable.

Thyroid: Partial right thyroidectomy.

Lymph nodes: Outside of the parotid regions, no enlarged or
suspicious lymph nodes are identified in the neck.

Vascular: Major vascular structures of the neck are grossly patent.
Mild atherosclerotic plaque is noted at the carotid bifurcations
without evidence of a flow limiting stenosis.

Limited intracranial: Unremarkable.

Visualized orbits: Bilateral cataract extraction.

Mastoids and visualized paranasal sinuses: Trace mucosal thickening
in the left sphenoid sinus. Clear mastoid air cells.

Skeleton: Focally advanced disc degeneration at C5-6. Moderate to
severe multilevel cervical facet arthrosis. No suspicious osseous
lesion.

Upper chest: Reported separately.

Other: None.
IMPRESSION: Right larger than left parotid masses which could reflect primary
parotid neoplasms or abnormal lymph nodes/metastases. No evidence of
malignancy elsewhere in the neck.

## 2021-09-29 DIAGNOSIS — M545 Low back pain, unspecified: Secondary | ICD-10-CM | POA: Diagnosis not present

## 2021-10-03 DIAGNOSIS — M545 Low back pain, unspecified: Secondary | ICD-10-CM | POA: Diagnosis not present

## 2021-10-05 DIAGNOSIS — G4733 Obstructive sleep apnea (adult) (pediatric): Secondary | ICD-10-CM | POA: Diagnosis not present

## 2021-10-05 DIAGNOSIS — M545 Low back pain, unspecified: Secondary | ICD-10-CM | POA: Diagnosis not present

## 2021-10-10 DIAGNOSIS — M545 Low back pain, unspecified: Secondary | ICD-10-CM | POA: Diagnosis not present

## 2021-10-12 DIAGNOSIS — M545 Low back pain, unspecified: Secondary | ICD-10-CM | POA: Diagnosis not present

## 2021-10-24 DIAGNOSIS — M545 Low back pain, unspecified: Secondary | ICD-10-CM | POA: Diagnosis not present

## 2021-10-26 DIAGNOSIS — M545 Low back pain, unspecified: Secondary | ICD-10-CM | POA: Diagnosis not present

## 2021-10-28 ENCOUNTER — Other Ambulatory Visit: Payer: Self-pay | Admitting: Internal Medicine

## 2021-10-28 DIAGNOSIS — M545 Low back pain, unspecified: Secondary | ICD-10-CM | POA: Diagnosis not present

## 2021-10-28 DIAGNOSIS — R1319 Other dysphagia: Secondary | ICD-10-CM | POA: Diagnosis not present

## 2021-10-28 DIAGNOSIS — E7800 Pure hypercholesterolemia, unspecified: Secondary | ICD-10-CM | POA: Diagnosis not present

## 2021-10-28 DIAGNOSIS — I129 Hypertensive chronic kidney disease with stage 1 through stage 4 chronic kidney disease, or unspecified chronic kidney disease: Secondary | ICD-10-CM | POA: Diagnosis not present

## 2021-10-28 DIAGNOSIS — M1990 Unspecified osteoarthritis, unspecified site: Secondary | ICD-10-CM | POA: Diagnosis not present

## 2021-10-28 DIAGNOSIS — E039 Hypothyroidism, unspecified: Secondary | ICD-10-CM | POA: Diagnosis not present

## 2021-11-03 ENCOUNTER — Ambulatory Visit
Admission: RE | Admit: 2021-11-03 | Discharge: 2021-11-03 | Disposition: A | Discharge status: Home / Self Care | Payer: Medicare Other | Source: Ambulatory Visit | Attending: Internal Medicine | Admitting: Internal Medicine

## 2021-11-03 DIAGNOSIS — R1319 Other dysphagia: Secondary | ICD-10-CM

## 2021-11-03 DIAGNOSIS — R1310 Dysphagia, unspecified: Secondary | ICD-10-CM | POA: Diagnosis not present

## 2021-11-03 DIAGNOSIS — K224 Dyskinesia of esophagus: Secondary | ICD-10-CM | POA: Diagnosis not present

## 2021-11-03 DIAGNOSIS — M545 Low back pain, unspecified: Secondary | ICD-10-CM | POA: Diagnosis not present

## 2021-11-07 DIAGNOSIS — G4733 Obstructive sleep apnea (adult) (pediatric): Secondary | ICD-10-CM | POA: Diagnosis not present

## 2021-11-28 DIAGNOSIS — H5231 Anisometropia: Secondary | ICD-10-CM | POA: Diagnosis not present

## 2021-11-28 DIAGNOSIS — H21501 Unspecified adhesions of iris, right eye: Secondary | ICD-10-CM | POA: Diagnosis not present

## 2021-11-28 DIAGNOSIS — H353111 Nonexudative age-related macular degeneration, right eye, early dry stage: Secondary | ICD-10-CM | POA: Diagnosis not present

## 2021-11-28 DIAGNOSIS — Z961 Presence of intraocular lens: Secondary | ICD-10-CM | POA: Diagnosis not present

## 2021-11-28 DIAGNOSIS — H02834 Dermatochalasis of left upper eyelid: Secondary | ICD-10-CM | POA: Diagnosis not present

## 2021-11-28 DIAGNOSIS — H04123 Dry eye syndrome of bilateral lacrimal glands: Secondary | ICD-10-CM | POA: Diagnosis not present

## 2021-11-28 DIAGNOSIS — H02831 Dermatochalasis of right upper eyelid: Secondary | ICD-10-CM | POA: Diagnosis not present

## 2021-11-28 DIAGNOSIS — H348112 Central retinal vein occlusion, right eye, stable: Secondary | ICD-10-CM | POA: Diagnosis not present

## 2021-12-07 DIAGNOSIS — G4733 Obstructive sleep apnea (adult) (pediatric): Secondary | ICD-10-CM | POA: Diagnosis not present

## 2021-12-28 DIAGNOSIS — M461 Sacroiliitis, not elsewhere classified: Secondary | ICD-10-CM | POA: Diagnosis not present

## 2022-01-02 DIAGNOSIS — Z23 Encounter for immunization: Secondary | ICD-10-CM | POA: Diagnosis not present

## 2022-01-03 DIAGNOSIS — G4733 Obstructive sleep apnea (adult) (pediatric): Secondary | ICD-10-CM | POA: Diagnosis not present

## 2022-01-07 DIAGNOSIS — G4733 Obstructive sleep apnea (adult) (pediatric): Secondary | ICD-10-CM | POA: Diagnosis not present

## 2022-02-01 DIAGNOSIS — G4733 Obstructive sleep apnea (adult) (pediatric): Secondary | ICD-10-CM | POA: Diagnosis not present

## 2022-02-06 DIAGNOSIS — G4733 Obstructive sleep apnea (adult) (pediatric): Secondary | ICD-10-CM | POA: Diagnosis not present

## 2022-02-22 DIAGNOSIS — G4733 Obstructive sleep apnea (adult) (pediatric): Secondary | ICD-10-CM | POA: Diagnosis not present

## 2022-04-03 DIAGNOSIS — M461 Sacroiliitis, not elsewhere classified: Secondary | ICD-10-CM | POA: Diagnosis not present

## 2022-04-09 DIAGNOSIS — G4733 Obstructive sleep apnea (adult) (pediatric): Secondary | ICD-10-CM | POA: Diagnosis not present

## 2022-04-25 ENCOUNTER — Other Ambulatory Visit: Payer: Self-pay

## 2022-04-25 ENCOUNTER — Emergency Department (HOSPITAL_COMMUNITY): Payer: Medicare Other

## 2022-04-25 ENCOUNTER — Inpatient Hospital Stay (HOSPITAL_COMMUNITY)
Admission: EM | Admit: 2022-04-25 | Discharge: 2022-04-27 | DRG: 176 | Disposition: A | Discharge status: Home / Self Care | Payer: Medicare Other | Source: Ambulatory Visit | Attending: Internal Medicine | Admitting: Internal Medicine

## 2022-04-25 ENCOUNTER — Encounter (INDEPENDENT_AMBULATORY_CARE_PROVIDER_SITE_OTHER): Payer: Medicare Other | Admitting: Ophthalmology

## 2022-04-25 DIAGNOSIS — G8929 Other chronic pain: Secondary | ICD-10-CM | POA: Diagnosis not present

## 2022-04-25 DIAGNOSIS — R5383 Other fatigue: Secondary | ICD-10-CM | POA: Diagnosis not present

## 2022-04-25 DIAGNOSIS — Z803 Family history of malignant neoplasm of breast: Secondary | ICD-10-CM | POA: Diagnosis not present

## 2022-04-25 DIAGNOSIS — I82811 Embolism and thrombosis of superficial veins of right lower extremities: Secondary | ICD-10-CM | POA: Diagnosis not present

## 2022-04-25 DIAGNOSIS — D49511 Neoplasm of unspecified behavior of right kidney: Secondary | ICD-10-CM | POA: Diagnosis not present

## 2022-04-25 DIAGNOSIS — E890 Postprocedural hypothyroidism: Secondary | ICD-10-CM | POA: Diagnosis not present

## 2022-04-25 DIAGNOSIS — M549 Dorsalgia, unspecified: Secondary | ICD-10-CM | POA: Diagnosis not present

## 2022-04-25 DIAGNOSIS — Z8249 Family history of ischemic heart disease and other diseases of the circulatory system: Secondary | ICD-10-CM

## 2022-04-25 DIAGNOSIS — R0602 Shortness of breath: Secondary | ICD-10-CM

## 2022-04-25 DIAGNOSIS — I2693 Single subsegmental pulmonary embolism without acute cor pulmonale: Secondary | ICD-10-CM | POA: Diagnosis not present

## 2022-04-25 DIAGNOSIS — Z79899 Other long term (current) drug therapy: Secondary | ICD-10-CM

## 2022-04-25 DIAGNOSIS — I2699 Other pulmonary embolism without acute cor pulmonale: Secondary | ICD-10-CM | POA: Diagnosis not present

## 2022-04-25 DIAGNOSIS — Z801 Family history of malignant neoplasm of trachea, bronchus and lung: Secondary | ICD-10-CM | POA: Diagnosis not present

## 2022-04-25 DIAGNOSIS — N2889 Other specified disorders of kidney and ureter: Secondary | ICD-10-CM | POA: Diagnosis not present

## 2022-04-25 DIAGNOSIS — G4733 Obstructive sleep apnea (adult) (pediatric): Secondary | ICD-10-CM | POA: Diagnosis not present

## 2022-04-25 DIAGNOSIS — D3001 Benign neoplasm of right kidney: Secondary | ICD-10-CM | POA: Diagnosis not present

## 2022-04-25 DIAGNOSIS — Z85528 Personal history of other malignant neoplasm of kidney: Secondary | ICD-10-CM | POA: Diagnosis not present

## 2022-04-25 DIAGNOSIS — Z79890 Hormone replacement therapy (postmenopausal): Secondary | ICD-10-CM

## 2022-04-25 DIAGNOSIS — I2609 Other pulmonary embolism with acute cor pulmonale: Secondary | ICD-10-CM | POA: Diagnosis not present

## 2022-04-25 DIAGNOSIS — Z90710 Acquired absence of both cervix and uterus: Secondary | ICD-10-CM

## 2022-04-25 DIAGNOSIS — R52 Pain, unspecified: Secondary | ICD-10-CM

## 2022-04-25 DIAGNOSIS — I119 Hypertensive heart disease without heart failure: Secondary | ICD-10-CM | POA: Diagnosis present

## 2022-04-25 DIAGNOSIS — K219 Gastro-esophageal reflux disease without esophagitis: Secondary | ICD-10-CM | POA: Diagnosis present

## 2022-04-25 DIAGNOSIS — E876 Hypokalemia: Secondary | ICD-10-CM | POA: Diagnosis not present

## 2022-04-25 DIAGNOSIS — M1990 Unspecified osteoarthritis, unspecified site: Secondary | ICD-10-CM | POA: Diagnosis present

## 2022-04-25 DIAGNOSIS — N289 Disorder of kidney and ureter, unspecified: Secondary | ICD-10-CM | POA: Diagnosis present

## 2022-04-25 DIAGNOSIS — R519 Headache, unspecified: Secondary | ICD-10-CM | POA: Diagnosis not present

## 2022-04-25 DIAGNOSIS — C649 Malignant neoplasm of unspecified kidney, except renal pelvis: Secondary | ICD-10-CM | POA: Diagnosis not present

## 2022-04-25 LAB — CBC WITH DIFFERENTIAL/PLATELET
Abs Immature Granulocytes: 0.02 10*3/uL (ref 0.00–0.07)
Basophils Absolute: 0 10*3/uL (ref 0.0–0.1)
Basophils Relative: 0 %
Eosinophils Absolute: 0 10*3/uL (ref 0.0–0.5)
Eosinophils Relative: 0 %
HCT: 43.6 % (ref 36.0–46.0)
Hemoglobin: 13.9 g/dL (ref 12.0–15.0)
Immature Granulocytes: 0 %
Lymphocytes Relative: 32 %
Lymphs Abs: 2.3 10*3/uL (ref 0.7–4.0)
MCH: 31.4 pg (ref 26.0–34.0)
MCHC: 31.9 g/dL (ref 30.0–36.0)
MCV: 98.6 fL (ref 80.0–100.0)
Monocytes Absolute: 0.4 10*3/uL (ref 0.1–1.0)
Monocytes Relative: 6 %
Neutro Abs: 4.3 10*3/uL (ref 1.7–7.7)
Neutrophils Relative %: 62 %
Platelets: 259 10*3/uL (ref 150–400)
RBC: 4.42 MIL/uL (ref 3.87–5.11)
RDW: 12.9 % (ref 11.5–15.5)
WBC: 7 10*3/uL (ref 4.0–10.5)
nRBC: 0 % (ref 0.0–0.2)

## 2022-04-25 LAB — BASIC METABOLIC PANEL
Anion gap: 10 (ref 5–15)
BUN: 11 mg/dL (ref 8–23)
CO2: 32 mmol/L (ref 22–32)
Calcium: 9.5 mg/dL (ref 8.9–10.3)
Chloride: 94 mmol/L — ABNORMAL LOW (ref 98–111)
Creatinine, Ser: 0.85 mg/dL (ref 0.44–1.00)
GFR, Estimated: >60 mL/min (ref >60)
Glucose, Bld: 121 mg/dL — ABNORMAL HIGH (ref 70–99)
Potassium: 3.3 mmol/L — ABNORMAL LOW (ref 3.5–5.1)
Sodium: 136 mmol/L (ref 135–145)

## 2022-04-25 LAB — BRAIN NATRIURETIC PEPTIDE: B Natriuretic Peptide: 32.8 pg/mL (ref 0.0–100.0)

## 2022-04-25 LAB — TSH: TSH: 0.517 u[IU]/mL (ref 0.350–4.500)

## 2022-04-25 LAB — TROPONIN I (HIGH SENSITIVITY)
Troponin I (High Sensitivity): 4 ng/L (ref <18)
Troponin I (High Sensitivity): 5 ng/L (ref <18)

## 2022-04-25 MED ORDER — HEPARIN BOLUS VIA INFUSION
4500.0000 [IU] | Freq: Once | INTRAVENOUS | Status: AC
Start: 2022-04-25 — End: 2022-04-25
  Administered 2022-04-25: 4500 [IU] via INTRAVENOUS
  Filled 2022-04-25: qty 4500

## 2022-04-25 MED ORDER — METOPROLOL SUCCINATE ER 50 MG PO TB24
50.0000 mg | ORAL_TABLET | Freq: Every day | ORAL | Status: DC
  Administered 2022-04-26 – 2022-04-27 (×2): 50 mg via ORAL
  Filled 2022-04-25: qty 1
  Filled 2022-04-25: qty 2

## 2022-04-25 MED ORDER — LEVOTHYROXINE SODIUM 75 MCG PO TABS
75.0000 ug | ORAL_TABLET | Freq: Every day | ORAL | Status: DC
  Administered 2022-04-26 – 2022-04-27 (×2): 75 ug via ORAL
  Filled 2022-04-25 (×2): qty 1

## 2022-04-25 MED ORDER — POTASSIUM CHLORIDE 20 MEQ PO PACK
40.0000 meq | PACK | Freq: Once | ORAL | Status: AC
  Administered 2022-04-25: 40 meq via ORAL
  Filled 2022-04-25: qty 2

## 2022-04-25 MED ORDER — ACETAMINOPHEN 325 MG PO TABS
650.0000 mg | ORAL_TABLET | Freq: Four times a day (QID) | ORAL | Status: DC | PRN
  Administered 2022-04-25 – 2022-04-26 (×2): 650 mg via ORAL
  Filled 2022-04-25 (×2): qty 2

## 2022-04-25 MED ORDER — HYDROCHLOROTHIAZIDE 25 MG PO TABS
25.0000 mg | ORAL_TABLET | Freq: Every morning | ORAL | Status: DC
  Administered 2022-04-26 – 2022-04-27 (×2): 25 mg via ORAL
  Filled 2022-04-25 (×2): qty 1

## 2022-04-25 MED ORDER — HEPARIN (PORCINE) 25000 UT/250ML-% IV SOLN
1100.0000 [IU]/h | INTRAVENOUS | Status: DC
  Administered 2022-04-25: 1100 [IU]/h via INTRAVENOUS
  Filled 2022-04-25: qty 250

## 2022-04-25 MED ORDER — POLYETHYLENE GLYCOL 3350 17 G PO PACK: 17.0000 g | PACK | Freq: Every day | ORAL | Status: DC | PRN

## 2022-04-25 MED ORDER — ACETAMINOPHEN 650 MG RE SUPP: 650.0000 mg | Freq: Four times a day (QID) | RECTAL | Status: DC | PRN

## 2022-04-25 MED ORDER — DICLOFENAC SODIUM 1 % EX GEL
4.0000 g | Freq: Four times a day (QID) | CUTANEOUS | Status: DC
  Administered 2022-04-25 – 2022-04-27 (×3): 4 g via TOPICAL
  Filled 2022-04-25: qty 100

## 2022-04-25 NOTE — H&P (Cosign Needed Addendum)
Date: 04/25/2022               Patient Name:  Cheryl Herman MRN: [id number]  DOB: 1940/06/05 Age / Sex: 82 y.o., female   PCP: Mckinley Jewel, MD         Medical Service: Internal Medicine Teaching Service         Attending Physician: Dr. Charise Killian, MD    First Contact: Starlyn Skeans, MD Pager: [phone]  Second Contact: Delene Ruffini, MD Pager: [phone]       After Hours (After 5p/  First Contact Pager: [phone]  weekends / holidays): Second Contact Pager: [phone]   SUBJECTIVE   Chief Complaint: Chest pain/shortness of breath  History of Present Illness: Cheryl Herman is an 82 year old with a past medical history of renal cell carcinoma, hypertension, hypothyroidism s/p partial thyroidectomy, GERD, OSA on CPAP, urge incontinence, chronic back pain, and detached retina who presented to the emergency department at the direction of her PCP after a pulmonary embolism was noted on recent CT scan done for 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  Patient and daughter at bedside deny any overt symptoms.  They state that she has not had any shortness of breath and no chest pain.  She does have some fatigue but this is not new for her, and patient and her daughter attributed it to age and chronic pain.  Has had some right lower leg cramping, but has not noticed any swelling in her legs.  Not having any calf tenderness either.  Upon further questioning in the emergency department, patient does endorse chest pain and shortness of breath for past 2-3 weeks. The patient thought it was reflux. She is currently not experiencing chest pain. She describes the chest pain as dull pressure, and rates it as a 6/10 pain at its worst. The pain is located on the central chest and radiates to the left side. She reports that her chest pain will wake her occasionally at night despite having her CPAP on. It is not associated with activity and seemed to improve when the patient took omeprazole or  gas-x. She has been reporting right lower extremity cramping so she was worked up for possible DVT but this was negative. She also reports intermittent headaches and lightheadedness upon standing. She denies recent immobility for extended period of time and denies long drives or flights. She denies prior history of blood clots and no history of GI bleed.  Home medications: B-complex with vitamin C 1 tablet Vitamin D 1000 unit tablet daily  Diclofenac '50mg'$  tablet BID HCTZ 25 daily Levothyroxine dose unknown per patient, will need to find the right dose Metoprolol succinate '50mg'$  daily  Multivitamin Omeprazole '40mg'$  daily Osteo Bi-Flex supplement Potassium '75mg'$  tablet  Not taking:  Atorvastatin Gabapentin Ketorolac eye drops Methocarbamol Naproxen Tizanidine Tramadol  Past Medical History:  Diagnosis Date   Arthritis    oa   GERD (gastroesophageal reflux disease)    Hypertension    Hypothyroidism     Past Surgical History:  Procedure Laterality Date   ABDOMINAL HYSTERECTOMY     partial   COLONOSCOPY WITH PROPOFOL N/A 10/27/2013   Procedure: COLONOSCOPY WITH PROPOFOL;  Surgeon: Garlan Fair, MD;  Location: [facility];  Service: Endoscopy;  Laterality: N/A;   ESOPHAGOGASTRODUODENOSCOPY (EGD) WITH PROPOFOL N/A 10/27/2013   Procedure: ESOPHAGOGASTRODUODENOSCOPY (EGD) WITH PROPOFOL;  Surgeon: Garlan Fair, MD;  Location: [facility];  Service: Endoscopy;  Laterality: N/A;   partial thryoidectomy  yrs ago  Social history:  Lives With: Husband Occupation: retired Buyer, retail: husband, sister, daughter Level of Function: independent at baseline, ambulates without assistance PCP: Doristine Bosworth, Michell Heinrich, MD at [place]: Denies current or prior tobacco or recreational drug use.   Family History:  Father: MI in 42s, aneurysm, deceased Brother: lung cancer, deceased Mother: breast cancer, lung cancer, deceased Denies family history of DM or  strokes.    Allergies: NKDA  Allergies as of 04/25/2022   (No Known Allergies)    Review of Systems: A complete ROS was negative except as per HPI.    OBJECTIVE:   Physical Exam: Blood pressure 130/63, pulse 72, temperature 98.6 F (37 C), temperature source Oral, resp. rate 16, SpO2 97 %.  Constitutional: well-appearing, appears as stated age female sitting in bed, in no acute distress HENT: normocephalic atraumatic, mucous membranes moist Eyes: conjunctiva non-erythematous Neck: painless lump on the right side of the neck Cardiovascular: regular rate and rhythm, no m/r/g Pulmonary/Chest: normal work of breathing on room air, lungs clear to auscultation bilaterally Abdominal: soft, non-tender, non-distended MSK: normal bulk and tone Neurological: alert & oriented x 3, 5/5 strength in bilateral upper and lower extremities Skin: warm and dry Psych: thoughts and mood normal  Labs: CBC    Component Value Date/Time   WBC 7.0 04/25/2022 1310   RBC 4.42 04/25/2022 1310   HGB 13.9 04/25/2022 1310   HCT 43.6 04/25/2022 1310   PLT 259 04/25/2022 1310   MCV 98.6 04/25/2022 1310   MCH 31.4 04/25/2022 1310   MCHC 31.9 04/25/2022 1310   RDW 12.9 04/25/2022 1310   LYMPHSABS 2.3 04/25/2022 1310   MONOABS 0.4 04/25/2022 1310   EOSABS 0.0 04/25/2022 1310   BASOSABS 0.0 04/25/2022 1310     CMP     Component Value Date/Time   NA 136 04/25/2022 1310   K 3.3 (L) 04/25/2022 1310   CL 94 (L) 04/25/2022 1310   CO2 32 04/25/2022 1310   GLUCOSE 121 (H) 04/25/2022 1310   BUN 11 04/25/2022 1310   CREATININE 0.85 04/25/2022 1310   CREATININE 1.07 (H) 05/25/2020 0801   CALCIUM 9.5 04/25/2022 1310   PROT 6.4 (L) 02/21/2021 0904   ALBUMIN 4.2 02/21/2021 0904   AST 35 02/21/2021 0904   AST 36 05/25/2020 0801   ALT 21 02/21/2021 0904   ALT 24 05/25/2020 0801   ALKPHOS 39 02/21/2021 0904   BILITOT 0.7 02/21/2021 0904   BILITOT 0.7 05/25/2020 0801   GFRNONAA >60 04/25/2022 1310    GFRNONAA 52 (L) 05/25/2020 0801   GFRAA  05/05/2009 1021    >60        The eGFR has been calculated using the MDRD equation. This calculation has not been validated in all clinical situations. eGFR's persistently <60 mL/min signify possible Chronic Kidney Disease.    Imaging: VAS [place] LOWER EXTREMITY VENOUS (DVT) (7a-7p)  Result Date: 04/25/2022  Lower Venous DVT Study Patient Name:  CL[facility]  Date of Exam:   04/25/2022 Medical Rec #: [id number]          Accession #:    [id number] Date of Birth: 07-07-1940          Patient Gender: F Patient Age:   66 years Exam Location:  [facility] Procedure:      VAS [place] LOWER EXTREMITY VENOUS (DVT) Referring Phys: HALEY SAGE --------------------------------------------------------------------------------  Indications: Pain, SOB, and Cramping.  Risk Factors: Confirmed PE. Comparison Study: No prior study. Performing Technologist: Leana Roe  Maag RVT, [place]  Examination Guidelines: A complete evaluation includes B-mode imaging, spectral Doppler, color Doppler, and power Doppler as needed of all accessible portions of each vessel. Bilateral testing is considered an integral part of a complete examination. Limited examinations for reoccurring indications may be performed as noted. The reflux portion of the exam is performed with the patient in reverse Trendelenburg.  +---------+---------------+---------+-----------+----------+--------------+ RIGHT    CompressibilityPhasicitySpontaneityPropertiesThrombus Aging +---------+---------------+---------+-----------+----------+--------------+ CFV      Full                                                        +---------+---------------+---------+-----------+----------+--------------+ SFJ      Full                                                        +---------+---------------+---------+-----------+----------+--------------+ FV Prox  Full                                                         +---------+---------------+---------+-----------+----------+--------------+ FV Mid   Full                                                        +---------+---------------+---------+-----------+----------+--------------+ FV DistalFull                                                        +---------+---------------+---------+-----------+----------+--------------+ PFV      Full                                                        +---------+---------------+---------+-----------+----------+--------------+ POP      Full                                                        +---------+---------------+---------+-----------+----------+--------------+ PTV      Full                                                        +---------+---------------+---------+-----------+----------+--------------+ PERO     Full                                                        +---------+---------------+---------+-----------+----------+--------------+  SSV      None           No       No                   Acute          +---------+---------------+---------+-----------+----------+--------------+   +----+---------------+---------+-----------+----------+--------------+ LEFTCompressibilityPhasicitySpontaneityPropertiesThrombus Aging +----+---------------+---------+-----------+----------+--------------+ CFV Full           Yes      Yes                                 +----+---------------+---------+-----------+----------+--------------+ SFJ Full                                                        +----+---------------+---------+-----------+----------+--------------+    Summary: RIGHT: - Findings consistent with acute superficial vein thrombosis involving the right small saphenous vein. - There is no evidence of deep vein thrombosis in the lower extremity.  - No cystic structure found in the popliteal fossa.  LEFT: - No evidence of common femoral vein obstruction.  *See  table(s) above for measurements and observations. Electronically signed by Deitra Mayo MD on 04/25/2022 at 7:33:52 PM.    Final      EKG: personally reviewed my interpretation is:    ASSESSMENT & PLAN:    Assessment & Plan by Problem: Principal Problem:   Pulmonary embolism ([place])   TARANIKA MARRA is a 82 y.o. with pertinent PMH of renal cell carcinoma, hypertension, hypothyroidism, GERD, OSA on CPAP, urge incontinence, chronic back pain, and detached retina who presented with chest pain and shortness of breath for past 2-3 weeks and admitted for pulmonary embolism, hospital day 0  #PE PE noted on outside CTA.  We do not have any imaging within our database to corroborate this.  We will need to obtain outside records preferably as opposed to repeating any sort of imaging unnecessarily.  Currently receiving heparin but can likely be transition to Eliquis tomorrow. No signs of right heart strain on physical exam, no hypotension tachycardia or increased oxygen demand.  Her EKG does show some deepening of Q wave in lead I compared to EKG from 14 years ago so do not believe this represents an acute change necessarily.  No other EKG changes noted.  Can consider obtaining EKG but do not believe that this needs to be done urgently.  PE noted to be in right lower lobe subsegmental so unlikely to be amenable to thrombectomy, currently on appropriate treatment. Unlikely to cause significant heart strain regardless.  She does have this history of oncocytoma versus RCC which may explain the PE.  She also sleeps with her legs down in a chair at night often which may contribute to any sort of venous stasis, but no other DVT was noted on that workup.  No other underlying risk factors identified. - obtain records from PCP including CTA and LE [place] - Continue heparin, can count likely transition to Eliquis tomorrow. - Consider echocardiogram if clinically indicated - continuous cardiac monitoring, likely  stop tomorrow. 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  This was noted to be CD117 positive.  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  She followed with Dr. Alen Blew.  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  No surgical intervention was planned for either of these neoplasms.  Active surveillance of renal lesion.  #Leg cramps She complains of leg cramps predominantly at night.  Question whether this is restless leg syndrome.  Potassium of 3.3 would not explain cramps.  I am not able to see any previous iron panels.  If this is never been performed may be worth checking as possible explanation for these cramps.  # Hypokalemia Replete. Monitor with daily BMP.  OSA on CPAP CPAP nightly  Hypothyroidism status post partial thyroidectomy Continue Synthroid 75 mcg  # HTN HCTZ  Diet: Normal VTE: Heparin IVF: None,None Code: Full  Prior to Admission Living Arrangement: Home, living independently  Anticipated Discharge Location: Home Barriers to Discharge: heparin drip  Dispo: Admit patient to Inpatient with expected length of stay greater than 2 midnights.  Signed: Delene Ruffini, MD

## 2022-04-25 NOTE — ED Provider Triage Note (Signed)
Emergency Medicine Provider Triage Evaluation Note  Cheryl Herman , a 82 y.o. female  was evaluated in triage.  Patient currently under treatment for renal cancer.  She had a CT today of her kidneys that found incidental right lower lobe PE.  Patient did endorse some shortness of breath but she believes this is chronic because she has spinal stenosis  No recent travel, surgery, is not under chemotherapy for her renal cancer, does not smoke, and does not believe her right leg has been swollen but says that it cramps sometimes.    Read is only available through PACS, impression below. IMPRESSION: 1. Minimal increase in size of enhancing lesion in the mid cortex of the RIGHT kidney compared to CT 09/10/2018. Findings consistent with very slowly enlarging renal neoplasm. 2. No evidence of metastatic disease in the abdomen. 3. Incidental finding: Linear filling defect in the proximal RIGHT lower lobe pulmonary artery consistent with ACUTE OR SUBACUTE PULMONARY EMBOLISM. Recommend clinical correlation.   Review of Systems  Positive: Negative:   Physical Exam  BP (!) 160/75 (BP Location: Right Arm)   Pulse 78   Temp 98.6 F (37 C) (Oral)   Resp 16   SpO2 96%  Gen:   Awake, no distress   Resp:  Normal effort  MSK:   Moves extremities without difficulty  Other:  No appreciable lower extremity swelling.  Strong DP pulses bilaterally.  Well-appearing, normal work of breathing.  Medical Decision Making  Medically screening exam initiated at 1:03 PM.  Appropriate orders placed.  Cheryl Herman was informed that the remainder of the evaluation will be completed by another provider, this initial triage assessment does not replace that evaluation, and the importance of remaining in the ED until their evaluation is complete.     Rhae Hammock, PA-C 04/25/22 1313

## 2022-04-25 NOTE — ED Provider Notes (Signed)
[place] Provider Note   CSN: [id number] Arrival date & time: 04/25/22  1300     History  No chief complaint on file.   Cheryl Herman is a 82 y.o. female.  HPI   This is an 82 year old female with history of renal cell carcinoma, hypertension, hypothyroid, GERD presenting to the emergency department due to chest pain and shortness of breath.  Patient symptoms started about 2 weeks ago, initially she attributed it to her reflux disease.  She had a CT scan done by her primary care which is notable for right lower lobe PE, sent to ED for further evaluation.  She states she has been having right lower extremity cramping, denies any pain to the left lower extremity.  Recent travel to [facility] last week.  No history of blood clots, no GI bleed.  Home Medications Prior to Admission medications   Medication Sig Start Date End Date Taking? Authorizing Provider  atorvastatin (LIPITOR) 10 MG tablet Take 1 tablet by mouth every other day.    [provider]  B Complex-C (B-COMPLEX WITH VITAMIN C) tablet Take 1 tablet by mouth daily.    [provider]  cholecalciferol (VITAMIN D) 1000 UNITS tablet Take 1,000 Units by mouth daily.    [provider]  diclofenac (VOLTAREN) 50 MG EC tablet Take 50 mg by mouth 2 (two) times daily. 09/20/20   [provider]  gabapentin (NEURONTIN) 100 MG capsule Take 100 mg by mouth every 8 (eight) hours. 07/28/20   [provider]  hydrochlorothiazide (HYDRODIURIL) 12.5 MG tablet Take 12.5 mg by mouth every morning. 05/19/19   [provider]  ketorolac (ACULAR) 0.5 % ophthalmic solution Apply to eye.    [provider]  levothyroxine (SYNTHROID) 75 MCG tablet Take 75 mcg by mouth daily before breakfast.    [provider]  levothyroxine (SYNTHROID) 88 MCG tablet Take by mouth.    [provider]  methocarbamol (ROBAXIN) 500  MG tablet Take 1 tablet (500 mg total) by mouth 2 (two) times daily as needed for muscle spasms. 02/21/21   Blanchie Dessert, MD  metoprolol succinate (TOPROL-XL) 50 MG 24 hr tablet TAKE AS DIRECTED ONCE DAILY 90 DAYS 06/19/19   [provider]  Misc Natural Products (OSTEO BI-FLEX JOINT SHIELD PO) Take 2 tablets by mouth daily.    [provider]  Multiple Vitamin (MULTIVITAMIN WITH MINERALS) TABS tablet Take 1 tablet by mouth daily.    [provider]  naproxen (NAPROSYN) 500 MG tablet Take 500 mg by mouth 2 (two) times daily. 06/02/20   [provider]  omeprazole (PRILOSEC) 40 MG capsule Take 40 mg by mouth daily.    [provider]  omeprazole (PRILOSEC) 40 MG capsule Take by mouth.    [provider]  Potassium 75 MG TABS  03/14/19   [provider]  tiZANidine (ZANAFLEX) 4 MG tablet Take 1 tablet by mouth as needed. Once per day as needed 05/10/20   [provider]  traMADol (ULTRAM) 50 MG tablet SMARTSIG:0.5-1 Tablet(s) By Mouth Every 8-12 Hours PRN 07/28/20   [provider]  Vitamins/Minerals TABS Take by mouth.    [provider]      Allergies    Patient has no known allergies.    Review of Systems   Review of Systems  Physical Exam Updated Vital Signs BP 130/63   Pulse 72   Temp 98.6 F (37 C) (  Oral)   Resp 16   SpO2 97%  Physical Exam Vitals and nursing note reviewed. Exam conducted with a chaperone present.  Constitutional:      Appearance: Normal appearance.  HENT:     Head: Normocephalic and atraumatic.  Eyes:     General: No scleral icterus.       Right eye: No discharge.        Left eye: No discharge.     Extraocular Movements: Extraocular movements intact.     Pupils: Pupils are equal, round, and reactive to light.  Cardiovascular:     Rate and Rhythm: Normal rate and regular rhythm.     Pulses: Normal pulses.     Heart sounds: Normal heart sounds. No murmur heard.    No  friction rub. No gallop.  Pulmonary:     Effort: Pulmonary effort is normal. No respiratory distress.     Breath sounds: Normal breath sounds.  Abdominal:     General: Abdomen is flat. Bowel sounds are normal. There is no distension.     Palpations: Abdomen is soft.     Tenderness: There is no abdominal tenderness.  Musculoskeletal:        General: Tenderness present.     Comments: Slight tenderness to the right lower calf, no obvious cord and lower extremities are roughly symmetric  Skin:    General: Skin is warm and dry.     Coloration: Skin is not jaundiced.  Neurological:     Mental Status: She is alert. Mental status is at baseline.     Coordination: Coordination normal.     ED Results / Procedures / Treatments   Labs (all labs ordered are listed, but only abnormal results are displayed) Labs Reviewed  BASIC METABOLIC PANEL - Abnormal; Notable for the following components:      Result Value   Potassium 3.3 (*)    Chloride 94 (*)    Glucose, Bld 121 (*)    All other components within normal limits  CBC WITH DIFFERENTIAL/PLATELET  BRAIN NATRIURETIC PEPTIDE  HEPARIN LEVEL (UNFRACTIONATED)  TROPONIN I (HIGH SENSITIVITY)  TROPONIN I (HIGH SENSITIVITY)    EKG None  Radiology VAS [place] LOWER EXTREMITY VENOUS (DVT) (7a-7p)  Result Date: 04/25/2022  Lower Venous DVT Study Patient Name:  Cheryl Herman [facility]  Date of Exam:   04/25/2022 Medical Rec #: [id number]          Accession #:    [id number] Date of Birth: 04/05/1940          Patient Gender: F Patient Age:   59 years Exam Location:  [facility] Procedure:      VAS [place] LOWER EXTREMITY VENOUS (DVT) Referring Phys: Reanna Scoggin --------------------------------------------------------------------------------  Indications: Pain, SOB, and Cramping.  Risk Factors: Confirmed PE. Comparison Study: No prior study. Performing Technologist: McKayla Maag RVT, [place]  Examination Guidelines: A complete evaluation includes B-mode imaging,  spectral Doppler, color Doppler, and power Doppler as needed of all accessible portions of each vessel. Bilateral testing is considered an integral part of a complete examination. Limited examinations for reoccurring indications may be performed as noted. The reflux portion of the exam is performed with the patient in reverse Trendelenburg.  +---------+---------------+---------+-----------+----------+--------------+ RIGHT    CompressibilityPhasicitySpontaneityPropertiesThrombus Aging +---------+---------------+---------+-----------+----------+--------------+ CFV      Full                                                        +---------+---------------+---------+-----------+----------+--------------+  SFJ      Full                                                        +---------+---------------+---------+-----------+----------+--------------+ FV Prox  Full                                                        +---------+---------------+---------+-----------+----------+--------------+ FV Mid   Full                                                        +---------+---------------+---------+-----------+----------+--------------+ FV DistalFull                                                        +---------+---------------+---------+-----------+----------+--------------+ PFV      Full                                                        +---------+---------------+---------+-----------+----------+--------------+ POP      Full                                                        +---------+---------------+---------+-----------+----------+--------------+ PTV      Full                                                        +---------+---------------+---------+-----------+----------+--------------+ PERO     Full                                                        +---------+---------------+---------+-----------+----------+--------------+ SSV       None           No       No                   Acute          +---------+---------------+---------+-----------+----------+--------------+   +----+---------------+---------+-----------+----------+--------------+ LEFTCompressibilityPhasicitySpontaneityPropertiesThrombus Aging +----+---------------+---------+-----------+----------+--------------+ CFV Full           Yes      Yes                                 +----+---------------+---------+-----------+----------+--------------+  SFJ Full                                                        +----+---------------+---------+-----------+----------+--------------+    Summary: RIGHT: - Findings consistent with acute superficial vein thrombosis involving the right small saphenous vein. - There is no evidence of deep vein thrombosis in the lower extremity.  - No cystic structure found in the popliteal fossa.  LEFT: - No evidence of common femoral vein obstruction.  *See table(s) above for measurements and observations.    Preliminary     Procedures .Critical Care  Performed by: Sherrill Raring, PA-C Authorized by: Sherrill Raring, PA-C   Critical care provider statement:    Critical care time (minutes):  30   Critical care start time:  04/25/2022 4:00 PM   Critical care end time:  04/25/2022 4:30 PM   Critical care time was exclusive of:  Separately billable procedures and treating other patients   Critical care was necessary to treat or prevent imminent or life-threatening deterioration of the following conditions:  Respiratory failure   Critical care was time spent personally by me on the following activities:  Development of treatment plan with patient or surrogate, discussions with consultants, evaluation of patient's response to treatment, examination of patient, ordering and review of laboratory studies, ordering and review of radiographic studies, ordering and performing treatments and interventions, pulse oximetry, re-evaluation of  patient's condition and review of old charts   Care discussed with: admitting provider       Medications Ordered in ED Medications  heparin bolus via infusion 4,500 Units (has no administration in time range)  heparin ADULT infusion 100 units/mL (25000 units/255m) (has no administration in time range)    ED Course/ Medical Decision Making/ A&P                             Medical Decision Making Amount and/or Complexity of Data Reviewed Labs: ordered.  Risk Prescription drug management. Decision regarding hospitalization.   This is an 82year old female presenting to the emergency department due to blood clot.  Patient had an outpatient CTA PE study which is notable for right sided pulmonary emboli, also having extremity cramping so we will get an ultrasound to assess for DVT.  Also check labs to include CBC, BMP, troponin and BNP.  No significant leukocytosis, anemia or thrombocytopenia.  Troponins are nondetectable, BNP is not elevated.  No deep venous thrombosis but is a superficial phlebitis.     Read is only available through PACS, impression below. IMPRESSION: 1. Minimal increase in size of enhancing lesion in the mid cortex of the RIGHT kidney compared to CT 09/10/2018. Findings consistent with very slowly enlarging renal neoplasm. 2. No evidence of metastatic disease in the abdomen. 3. Incidental finding: Linear filling defect in the proximal RIGHT lower lobe pulmonary artery consistent with ACUTE OR SUBACUTE PULMONARY EMBOLISM. Recommend clinical correlation.    I will admit the patient to the hospitalist service, heparin ordered for anticoagulation for underlying PE.  I consulted the hospitalist who agrees with admission.        Final Clinical Impression(s) / ED Diagnoses Final diagnoses:  Single subsegmental pulmonary embolism without acute cor pulmonale ([facility]    Rx / DC Orders ED Discharge Orders  None         Sherrill Raring, [place] 04/25/22  1630    Pattricia Boss, MD 05/03/22 1440

## 2022-04-25 NOTE — Hospital Course (Addendum)
Cheryl Herman is a 82 y.o. with pertinent PMH of renal cell carcinoma, hypertension, hypothyroidism, GERD, OSA on CPAP, urge incontinence, chronic back pain, and detached retina who presented with chest pain and shortness of breath for past 2-3 weeks and admitted for pulmonary embolism.   #PE Patient presented to her PCP w/ compaints of CP and SOB. PE noted on outside CTA performed by PCP. Imaging not on our file. Will need to obtain outside records to confirm. Patient has history of oncocytoma versus RCC which may explain the PE. DVT study negative. No signs of right heart strain on EKG. Hemodynamically stable. Currently receiving IV heparin but can likely be transition to Eliquis today. Hgb WNL today. 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 Patient was followed by Dr. Alen Blew. Per oncology note in 04/2020, [place] of neck mass was performed demonstrating mass in the region of the right parotid gland. 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 No surgical intervention, chemotherapy, or radiation were planned for these neoplasms. Patient was to have active surveillance of her renal mass. Plan to obtain prior records vs reach out to oncologist?***   #Leg cramps Presented w/ leg cramps worse on the right side that primarily occur at night. Had mild hypokalemia on admission but likely not the cause of her cramps. DVT study demonstrates acute superficial vein thrombosis involving the right small saphenous vein w/o DVT. Cramping today***.    #Hypokalemia - resolved Repleted. Potassium 4.1 today.  -Trend BMP   #OSA on CPAP -Continue CPAP nightly   #Hypothyroidism s/p partial  thyroidectomy TSH WNL.  -Continue home Synthroid 75 mcg   #HTN Intermittently hypertensive w/ systolic BP b/t [id number] overnight.  -Continue home HCTZ  ____ The renal lesion was consistent with an oncocytoma. Distinguishing an oncocytoma from an [place] histologically can occasionally be difficult. In one study, approximately 5 percent of solid tumors previously thought to be RCCs may have been oncocytomas [136]. Early reports of "metastatic oncocytomas" probably represented chromophobe RCCs  Renal oncocytomas almost invariably behave in a benign fashion, despite the recognition that the growth rate could be similar to [place]. Even when very large, they are generally well encapsulated and are rarely invasive or associated with metastases   A coexisting RCC can be identified in 10 to 32 percent of patients with oncocytoma, especially in those patients presenting with diffuse oncocytic nodules involving the renal parenchyma (also known as renal oncocytosis)   patients with oncocytomas should be closely monitored and treated if there is evidence of rapid growth of the renal tumor.    high frequency of coexisting RCCs   LYMPH NODE, RIGHT NECK, NEEDLE CORE BIOPSY:  - Oncocytic neoplasm, see comment.  COMMENT:  Immunohistochemistry for CK7, PAX 8 and E-cadherin is positive.  CA-IX,  CD10, CD117, S-100, TTF-1 and Thyroglobulin are negative.  The provided  clinical history of a long-standing renal mass is noted.  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  In one study, none of 36 chromophobe RCCs expressed cyclin D1  *Patient had her renal mass bx in 2017 and neck lymph node bx in 2022. * need to figure out when the last time she  actually saw her oncologist was? Could this lymph node be an RCC met?. * her renal mass was CD117 positive.  *** Dr. Verne Carrow note 10/2020 discussed renal lesion as stable and  an unrelated parotid gland oncoytoma not related to renal lesion. I wonder if the lymph node is related to the parotid lesion?  *** [name] note 04/2020 talks about right upper neck mass that US obtained and showed mass in the region of the right parotid gland.  * don't need to worry about renal lesion stuff, but might be worth commenting on in DC. If CA, may be able to attribute PE to this? She may also have venous stasis Her leg cramping could be iron deficiency? -----------------------------------------------------------------------------  2/28: Patient suspected intermittent chest pain was secondary to GERD or gas. Has had this pain intermittently since 01/2022. Not much trouble breathing at home. Continues to have right lower extremity pain intermittently over the last 3 months. Describes the pain as tight and improves w/ ambulation. Denies pain of left leg. Denies abnormal crawling sensation in her RLE. Has outpatient f/u scheduled w/ GI for difficulty swallowing. Told in 02/2022 that she no longer needed to follow up with oncology by their office.

## 2022-04-25 NOTE — ED Triage Notes (Signed)
Pt sent from her doctor after having +PE on CTAP that was done as a recheck for renal cell carcinoma. Pt endorses mild sob and chest pain that she thinks is indigestion.

## 2022-04-25 NOTE — ED Notes (Signed)
Report given to next shift RN.

## 2022-04-25 NOTE — ED Notes (Signed)
Patient AAOX4. Respirations even and unlabored. No acute concerns. Family at bedside. Bed in lowest position. Call light within reach.

## 2022-04-25 NOTE — Progress Notes (Signed)
ANTICOAGULATION CONSULT NOTE - Initial Consult  Pharmacy Consult for heparin Indication: pulmonary embolus  No Known Allergies  Patient Measurements:   Heparin Dosing Weight: 70kg  Vital Signs: Temp: 98.6 F (37 C) (02/27 1307) Temp Source: Oral (02/27 1307) BP: 130/63 (02/27 1430) Pulse Rate: 72 (02/27 1430)  Labs: Recent Labs    04/25/22 1310 04/25/22 1325  HGB 13.9  --   HCT 43.6  --   PLT 259  --   CREATININE 0.85  --   TROPONINIHS  --  4    CrCl cannot be calculated (Unknown ideal weight.).   Medical History: Past Medical History:  Diagnosis Date   Arthritis    oa   GERD (gastroesophageal reflux disease)    Hypertension    Hypothyroidism     Assessment: 14 YOF presenting with SOB, CP, outpt CT with PE, doppler here with superficial vein thrombus. She is not on anticoagulation PTA, CBC wnl  Goal of Therapy:  Heparin level 0.3-0.7 units/ml Monitor platelets by anticoagulation protocol: Yes   Plan:  Heparin 4500 units IV x 1, and gtt at 1100 units/hr F/u 8 hour heparin level F/u long term [facility] plan  Bertis Ruddy, PharmD, [place] Pharmacist ED Pharmacist Phone # [phone] 04/25/2022 3:46 PM

## 2022-04-25 NOTE — Progress Notes (Signed)
Right lower extremity venous study completed.   Preliminary results relayed to [employer], Therapist, sports.  Please see CV Procedures for preliminary results.  Ahyan Kreeger, RVT  3:10 PM 04/25/22

## 2022-04-26 ENCOUNTER — Encounter (HOSPITAL_COMMUNITY): Payer: Self-pay | Admitting: Internal Medicine

## 2022-04-26 ENCOUNTER — Other Ambulatory Visit (HOSPITAL_COMMUNITY): Payer: Self-pay

## 2022-04-26 DIAGNOSIS — I2693 Single subsegmental pulmonary embolism without acute cor pulmonale: Secondary | ICD-10-CM | POA: Diagnosis not present

## 2022-04-26 LAB — MAGNESIUM: Magnesium: 1.8 mg/dL (ref 1.7–2.4)

## 2022-04-26 LAB — BASIC METABOLIC PANEL
Anion gap: 12 (ref 5–15)
BUN: 14 mg/dL (ref 8–23)
CO2: 29 mmol/L (ref 22–32)
Calcium: 8.8 mg/dL — ABNORMAL LOW (ref 8.9–10.3)
Chloride: 98 mmol/L (ref 98–111)
Creatinine, Ser: 0.8 mg/dL (ref 0.44–1.00)
GFR, Estimated: >60 mL/min (ref >60)
Glucose, Bld: 102 mg/dL — ABNORMAL HIGH (ref 70–99)
Potassium: 4.1 mmol/L (ref 3.5–5.1)
Sodium: 139 mmol/L (ref 135–145)

## 2022-04-26 LAB — HEPARIN LEVEL (UNFRACTIONATED)
Heparin Unfractionated: >1.1 IU/mL — ABNORMAL HIGH (ref 0.30–0.70)
Heparin Unfractionated: 0.9 IU/mL — ABNORMAL HIGH (ref 0.30–0.70)

## 2022-04-26 MED ORDER — APIXABAN 5 MG PO TABS: 5.0000 mg | ORAL_TABLET | Freq: Two times a day (BID) | ORAL | Status: DC

## 2022-04-26 MED ORDER — HEPARIN (PORCINE) 25000 UT/250ML-% IV SOLN
800.0000 [IU]/h | INTRAVENOUS | Status: DC
  Administered 2022-04-26: 800 [IU]/h via INTRAVENOUS

## 2022-04-26 MED ORDER — PANTOPRAZOLE SODIUM 40 MG PO TBEC
40.0000 mg | DELAYED_RELEASE_TABLET | Freq: Two times a day (BID) | ORAL | Status: DC
  Administered 2022-04-26 – 2022-04-27 (×3): 40 mg via ORAL
  Filled 2022-04-26 (×3): qty 1

## 2022-04-26 MED ORDER — APIXABAN 5 MG PO TABS
10.0000 mg | ORAL_TABLET | Freq: Two times a day (BID) | ORAL | Status: DC
  Administered 2022-04-26 – 2022-04-27 (×3): 10 mg via ORAL
  Filled 2022-04-26 (×3): qty 2

## 2022-04-26 MED ORDER — METHOCARBAMOL 500 MG PO TABS: 500.0000 mg | ORAL_TABLET | Freq: Once | ORAL | Status: DC

## 2022-04-26 MED ORDER — HEPARIN (PORCINE) 25000 UT/250ML-% IV SOLN
950.0000 [IU]/h | INTRAVENOUS | Status: DC
  Administered 2022-04-26: 950 [IU]/h via INTRAVENOUS

## 2022-04-26 NOTE — Plan of Care (Signed)
  Problem: Education: Goal: Knowledge of General Education information will improve Description: Including pain rating scale, medication(s)/side effects and non-pharmacologic comfort measures Outcome: Progressing   Problem: Safety: Goal: Ability to remain free from injury will improve Outcome: Progressing   

## 2022-04-26 NOTE — Progress Notes (Addendum)
HD#1 SUBJECTIVE:  Patient Summary: Cheryl Herman is a 82 y.o. 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 She was admitted for pulmonary embolism.   Overnight Events: No acute distress overnight.     Interm History: Patient continues to have some leg pain in her lower right extremity. She states that the pain is intermittent and will get better with activity.   OBJECTIVE:  Vital Signs: Vitals:   04/26/22 0800 04/26/22 0900 04/26/22 1000 04/26/22 1200  BP: (!) 148/78 (!) 163/79 (!) 154/76 (!) 142/79  Pulse: 73 73 69 68  Resp: '14 15 20 '$ (!) 21  Temp: 98.2 F (36.8 C)   98.3 F (36.8 C)  TempSrc: Oral     SpO2: 99% 99% 97% 98%  Weight:      Height:       Supplemental O2: Room Air SpO2: 98 %  Filed Weights   04/26/22 0320  Weight: 63.5 kg     Intake/Output Summary (Last 24 hours) at 04/26/2022 1254 Last data filed at 04/26/2022 1236 Gross per 24 hour  Intake 560.43 ml  Output 1000 ml  Net -439.57 ml   Net IO Since Admission: -439.57 mL [04/26/22 1254]  Physical Exam: Physical Exam Constitutional:      General: She is not in acute distress.    Appearance: Normal appearance. She is not ill-appearing or diaphoretic.  Neck:     Comments: Painless, mobile lump noted on the right side of the neck under patient's chin.  Cardiovascular:     Rate and Rhythm: Normal rate and regular rhythm.     Pulses: Normal pulses.  Pulmonary:     Effort: Pulmonary effort is normal. No respiratory distress.     Breath sounds: Normal breath sounds. No wheezing.  Abdominal:     General: Abdomen is flat. Bowel sounds are normal.     Palpations: Abdomen is soft.  Musculoskeletal:      Cervical back: No rigidity.     Right lower leg: No edema.     Left lower leg: No edema.     Comments: Tenderness in patient's right lower extremity. No erythema or swelling noted in bilateral lower extremities.  Skin:    General: Skin is warm and dry.  Neurological:     General: No focal deficit present.     Mental Status: She is alert and oriented to person, place, and time.  Psychiatric:        Mood and Affect: Mood normal.        Behavior: Behavior normal.     Patient Lines/Drains/Airways Status     Active Line/Drains/Airways     Name Placement date Placement time Site Days   Peripheral IV 04/25/22 22 G Left;Posterior Hand 04/25/22  1650  Hand  1   Peripheral IV 04/25/22 20 G Left;Posterior Forearm 04/25/22  1636  Forearm  1   External Urinary Catheter 04/25/22  1938  --  1   Incision (Closed) 09/06/15 Flank Right 09/06/15  0847  -- 2424   Wound / Incision (Open or Dehisced) 04/27/20 Puncture Neck Right Biopsy site 04/27/20  1600  Neck  729            Pertinent Labs:  Latest Ref Rng & Units 04/25/2022    1:10 PM 02/21/2021    9:04 AM 09/06/2015    6:20 AM  CBC  WBC 4.0 - 10.5 K/uL 7.0  6.2  7.2   Hemoglobin 12.0 - 15.0 g/dL 13.9  13.4  13.5   Hematocrit 36.0 - 46.0 % 43.6  41.4  42.1   Platelets 150 - 400 K/uL 259  266  281        Latest Ref Rng & Units 04/26/2022    4:48 AM 04/25/2022    1:10 PM 02/21/2021    9:04 AM  CMP  Glucose 70 - 99 mg/dL 102  121  92   BUN 8 - 23 mg/dL '14  11  15   '$ Creatinine 0.44 - 1.00 mg/dL 0.80  0.85  0.77   Sodium 135 - 145 mmol/L 139  136  139   Potassium 3.5 - 5.1 mmol/L 4.1  3.3  3.6   Chloride 98 - 111 mmol/L 98  94  99   CO2 22 - 32 mmol/L 29  32  32   Calcium 8.9 - 10.3 mg/dL 8.8  9.5  9.3   Total Protein 6.5 - 8.1 g/dL   6.4   Total Bilirubin 0.3 - 1.2 mg/dL   0.7   Alkaline Phos 38 - 126 U/L   39   AST 15 - 41 U/L   35   ALT 0 - 44 U/L   21     No results for input(s): "GLUCAP" in the last 72 hours.    Pertinent Imaging: VAS [place] LOWER EXTREMITY VENOUS (DVT) (7a-7p)  Result Date: 04/25/2022  Lower Venous DVT Study Patient Name:  Cheryl Herman [facility]  Date of Exam:   04/25/2022 Medical Rec #: [id number]          Accession #:    [id number] Date of Birth: 1940/03/24          Patient Gender: F Patient Age:   6 years Exam Location:  [facility] Procedure:      VAS [place] LOWER EXTREMITY VENOUS (DVT) Referring Phys: HALEY SAGE --------------------------------------------------------------------------------  Indications: Pain, SOB, and Cramping.  Risk Factors: Confirmed PE. Comparison Study: No prior study. Performing Technologist: McKayla Maag RVT, [place]  Examination Guidelines: A complete evaluation includes B-mode imaging, spectral Doppler, color Doppler, and power Doppler as needed of all accessible portions of each vessel. Bilateral testing is considered an integral part of a complete examination. Limited examinations for reoccurring indications may be performed as noted. The reflux portion of the exam is performed with the patient in reverse Trendelenburg.  +---------+---------------+---------+-----------+----------+--------------+ RIGHT    CompressibilityPhasicitySpontaneityPropertiesThrombus Aging +---------+---------------+---------+-----------+----------+--------------+ CFV      Full                                                        +---------+---------------+---------+-----------+----------+--------------+ SFJ      Full                                                        +---------+---------------+---------+-----------+----------+--------------+ FV Prox  Full                                                        +---------+---------------+---------+-----------+----------+--------------+  FV Mid   Full                                                        +---------+---------------+---------+-----------+----------+--------------+ FV DistalFull                                                         +---------+---------------+---------+-----------+----------+--------------+ PFV      Full                                                        +---------+---------------+---------+-----------+----------+--------------+ POP      Full                                                        +---------+---------------+---------+-----------+----------+--------------+ PTV      Full                                                        +---------+---------------+---------+-----------+----------+--------------+ PERO     Full                                                        +---------+---------------+---------+-----------+----------+--------------+ SSV      None           No       No                   Acute          +---------+---------------+---------+-----------+----------+--------------+   +----+---------------+---------+-----------+----------+--------------+ LEFTCompressibilityPhasicitySpontaneityPropertiesThrombus Aging +----+---------------+---------+-----------+----------+--------------+ CFV Full           Yes      Yes                                 +----+---------------+---------+-----------+----------+--------------+ SFJ Full                                                        +----+---------------+---------+-----------+----------+--------------+    Summary: RIGHT: - Findings consistent with acute superficial vein thrombosis involving the right small saphenous vein. - There is no evidence of deep vein thrombosis in the lower extremity.  - No cystic structure found in the popliteal fossa.  LEFT: - No evidence of common femoral vein obstruction.  *See table(s) above for measurements and  observations. Electronically signed by Deitra Mayo MD on 04/25/2022 at 7:33:52 PM.    Final     ASSESSMENT/PLAN:  Assessment: Principal Problem:   Pulmonary embolism ([place])   BRADLEE ARAKELIAN is a 82 y.o. with  pertinent PMH of renal cell carcinoma, hypertension, hypothyroidism s/p partial thyroidectomy, GERD, OSA on CPAP, urge incontinence, chronic back pain, and detached retina who presented with a pulmonary embolism  found on CT scan and admitted for PE. This is hospital day 1.  Plan: #PE PE noted on outside CTA. We did not have any imaging within our database to corroborate this. Today we obtained copy of CT scan report from [facility] done on 04/25/22. CT report shows linear filling defect in the proximal right lower lobe pulmonary artery consistent with acute or subacute pulmonary embolism. Transitioned from heparin drip to oral Eliquis today. No signs of right heart strain on physical exam today, no hypotension, tachycardia, or increased oxygen demand. BNP and troponins normal yesterday. We will obtain a TTE to ensure no heart strain. Patient does have a history of oncocytoma vs RCC which may explain the PE, but we will need further information from the oncology office ([place] [facility]) to guide anticoagulation therapy duration. She also sleeps with her legs down in a chair at night often which may contribute to any sort of venous stasis, but no other DVT was noted on that workup. No other underlying risk factors identified.  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  This was noted to be CD117 positive.  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  She was followed by Dr. Alen Blew.  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  No surgical intervention  was planned for either of these neoplasms.  Active surveillance of renal lesion. -will obtain records from [facility] to see if patient is being followed by a new physician since her prior oncologist retired -need to confirm that no surgical intervention was planned for neoplasms  #Leg cramps She complains of leg cramps predominantly at night. Ultrasound of lower extremities showed acute superficial vein thrombosis involving the right small saphenous vein, but no evidence of deep vein thrombosis in the lower extremity. Question whether this is restless leg syndrome. Magnesium level within normal limits.  Potassium of 3.3 would not explain cramps.    # Hypokalemia -Replete. -Monitor with daily BMP.   OSA on CPAP -CPAP nightly   Hypothyroidism status post partial thyroidectomy -Continue Synthroid 75 mcg   # HTN -continue HCTZ '25mg'$    Best Practice: Diet: Regular diet IVF: Fluids: none VTE:  Eliquis Code: Full Therapy Recs: None, DME: none Family Contact: Daughter, at bedside. DISPO: Anticipated discharge tomorrow to Home pending  normal echo and tolerating Eliquis .    Signature: Marisa Cyphers, Medical Student   Please contact the on call pager after 5 pm and on weekends at [phone].

## 2022-04-26 NOTE — Progress Notes (Signed)
Pt stated she will be gong home tomorrow and doesn't want to wear CPAP for tonight.

## 2022-04-26 NOTE — Progress Notes (Signed)
ANTICOAGULATION CONSULT NOTE   Pharmacy Consult for heparin Indication: pulmonary embolus  No Known Allergies  Patient Measurements:   Heparin Dosing Weight: 70kg  Vital Signs: Temp: 97.9 F (36.6 C) (02/27 2347) Temp Source: Oral (02/27 2347) BP: 138/58 (02/27 2347) Pulse Rate: 75 (02/27 2347)  Labs: Recent Labs    04/25/22 1310 04/25/22 1325 04/25/22 1638 04/26/22 0050  HGB 13.9  --   --   --   HCT 43.6  --   --   --   PLT 259  --   --   --   HEPARINUNFRC  --   --   --  >1.10*  CREATININE 0.85  --   --   --   TROPONINIHS  --  4 5  --      CrCl cannot be calculated (Unknown ideal weight.).   Medical History: Past Medical History:  Diagnosis Date   Arthritis    oa   GERD (gastroesophageal reflux disease)    Hypertension    Hypothyroidism     Assessment: 89 YOF presenting with SOB, CP, outpt CT with PE, doppler here with superficial vein thrombus. She is not on anticoagulation PTA, CBC wnl  2/28 AM update:  Heparin level elevated  Confirmed drawn from arm opposite of heparin infusion  Goal of Therapy:  Heparin level 0.3-0.7 units/ml Monitor platelets by anticoagulation protocol: Yes   Plan:  Hold heparin x 1 hr Re-start heparin drip at 950 units/hr 1000 heparin level  Narda Bonds, PharmD, BCPS Clinical Pharmacist Phone: [phone]

## 2022-04-26 NOTE — Discharge Instructions (Addendum)
Information on my medicine - ELIQUIS (apixaban)  This medication education was reviewed with me or my healthcare representative as part of my discharge preparation.   Why was Eliquis prescribed for you? Eliquis was prescribed to treat blood clots that may have been found in the veins of your legs (deep vein thrombosis) or in your lungs (pulmonary embolism) and to reduce the risk of them occurring again.  What do You need to know about Eliquis ? The starting dose is 10 mg (two 5 mg tablets) taken TWICE daily for the FIRST SEVEN (7) DAYS, then on 05/03/22  the dose is reduced to ONE 5 mg tablet taken TWICE daily.  Eliquis may be taken with or without food.   Try to take the dose about the same time in the morning and in the evening. If you have difficulty swallowing the tablet whole please discuss with your pharmacist how to take the medication safely.  Take Eliquis exactly as prescribed and DO NOT stop taking Eliquis without talking to the doctor who prescribed the medication.  Stopping may increase your risk of developing a new blood clot.  Refill your prescription before you run out.  After discharge, you should have regular check-up appointments with your healthcare provider that is prescribing your Eliquis.    What do you do if you miss a dose? If a dose of ELIQUIS is not taken at the scheduled time, take it as soon as possible on the same day and twice-daily administration should be resumed. The dose should not be doubled to make up for a missed dose.  Important Safety Information A possible side effect of Eliquis is bleeding. You should call your healthcare provider right away if you experience any of the following: Bleeding from an injury or your nose that does not stop. Unusual colored urine (red or dark brown) or unusual colored stools (red or black). Unusual bruising for unknown reasons. A serious fall or if you hit your head (even if there is no bleeding).  Some medicines  may interact with Eliquis and might increase your risk of bleeding or clotting while on Eliquis. To help avoid this, consult your healthcare provider or pharmacist prior to using any new prescription or non-prescription medications, including herbals, vitamins, non-steroidal anti-inflammatory drugs (NSAIDs) and supplements.  This website has more information on Eliquis (apixaban): http://www.eliquis.com/eliquis/home ============================================  Pulmonary Embolism    A pulmonary embolism (PE) is a sudden blockage or decrease of blood flow in one or both lungs. Most blockages come from a blood clot that forms in the vein of a lower leg, thigh, or arm (deep vein thrombosis, DVT) and travels to the lungs. A clot is blood that has thickened into a gel or solid. PE is a dangerous and life-threatening condition that needs to be treated right away.  What are the causes? This condition is usually caused by a blood clot that forms in a vein and moves to the lungs. In rare cases, it may be caused by air, fat, part of a tumor, or other tissue that moves through the veins and into the lungs.  What increases the risk? The following factors may make you more likely to develop this condition: Experiencing a traumatic injury, such as breaking a hip or leg. Having: A spinal cord injury. Orthopedic surgery, especially hip or knee replacement. Any major surgery. A stroke. DVT. Blood clots or blood clotting disease. Long-term (chronic) lung or heart disease. Cancer treated with chemotherapy. A central venous catheter. Taking medicines that  contain estrogen. These include birth control pills and hormone replacement therapy. Being: Pregnant. In the period of time after your baby is delivered (postpartum). Older than age 24. Overweight. A smoker, especially if you have other risks.  What are the signs or symptoms? Symptoms of this condition usually start suddenly and  include: Shortness of breath during activity or at rest. Coughing, coughing up blood, or coughing up blood-tinged mucus. Chest pain that is often worse with deep breaths. Rapid or irregular heartbeat. Feeling light-headed or dizzy. Fainting. Feeling anxious. Fever. Sweating. Pain and swelling in a leg. This is a symptom of DVT, which can lead to PE. How is this diagnosed? This condition may be diagnosed based on: Your medical history. A physical exam. Blood tests. CT pulmonary angiogram. This test checks blood flow in and around your lungs. Ventilation-perfusion scan, also called a lung VQ scan. This test measures air flow and blood flow to the lungs. An ultrasound of the legs.  How is this treated? Treatment for this condition depends on many factors, such as the cause of your PE, your risk for bleeding or developing more clots, and other medical conditions you have. Treatment aims to remove, dissolve, or stop blood clots from forming or growing larger. Treatment may include: Medicines, such as: Blood thinning medicines (anticoagulants) to stop clots from forming. Medicines that dissolve clots (thrombolytics). Procedures, such as: Using a flexible tube to remove a blood clot (embolectomy) or to deliver medicine to destroy it (catheter-directed thrombolysis). Inserting a filter into a large vein that carries blood to the heart (inferior vena cava). This filter (vena cava filter) catches blood clots before they reach the lungs. Surgery to remove the clot (surgical embolectomy). This is rare. You may need a combination of immediate, long-term (up to 3 months after diagnosis), and extended (more than 3 months after diagnosis) treatments. Your treatment may continue for several months (maintenance therapy). You and your health care provider will work together to choose the treatment program that is best for you.  Follow these instructions at home: Medicines Take over-the-counter and  prescription medicines only as told by your health care provider. If you are taking an anticoagulant medicine: Take the medicine every day at the same time each day. Understand what foods and drugs interact with your medicine. Understand the side effects of this medicine, including excessive bruising or bleeding. Ask your health care provider or pharmacist about other side effects.  General instructions Wear a medical alert bracelet or carry a medical alert card that says you have had a PE and lists what medicines you take. Ask your health care provider when you may return to your normal activities. Avoid sitting or lying for a long time without moving. Maintain a healthy weight. Ask your health care provider what weight is healthy for you. Do not use any products that contain nicotine or tobacco, such as cigarettes, e-cigarettes, and chewing tobacco. If you need help quitting, ask your health care provider. Talk with your health care provider about any travel plans. It is important to make sure that you are still able to take your medicine while on trips. Keep all follow-up visits as told by your health care provider. This is important.  Contact a health care provider if: You missed a dose of your blood thinner medicine.  Get help right away if: You have: New or increased pain, swelling, warmth, or redness in an arm or leg. Numbness or tingling in an arm or leg. Shortness of breath during  activity or at rest. A fever. Chest pain. A rapid or irregular heartbeat. A severe headache. Vision changes. A serious fall or accident, or you hit your head. Stomach (abdominal) pain. Blood in your vomit, stool, or urine. A cut that will not stop bleeding. You cough up blood. You feel light-headed or dizzy. You cannot move your arms or legs. You are confused or have memory loss.  These symptoms may represent a serious problem that is an emergency. Do not wait to see if the symptoms will go  away. Get medical help right away. Call your local emergency services (911 in the [place] Do not drive yourself to the hospital. Summary A pulmonary embolism (PE) is a sudden blockage or decrease of blood flow in one or both lungs. PE is a dangerous and life-threatening condition that needs to be treated right away. Treatments for this condition usually include medicines to thin your blood (anticoagulants) or medicines to break apart blood clots (thrombolytics). If you are given blood thinners, it is important to take the medicine every day at the same time each day. Understand what foods and drugs interact with any medicines that you are taking. If you have signs of PE or DVT, call your local emergency services (911 in the [place] This information is not intended to replace advice given to you by your health care provider. Make sure you discuss any questions you have with your health care provider. Document Revised: 11/21/2017 Document Reviewed: 11/21/2017 Elsevier Patient Education  2020 [employer].  -------------------------------------------- Dennis Bast were hospitalized for a blood clot in your lungs (pulmonary embolism).   Hospital Course: We found a blood clot in your lungs on your CT scan. We started you on a blood thinner (eliquis). Given your history of tumors (kidney, parotid gland), we are suspicious that your blood clot may have been related to these conditions. Therefore, it is possible that you will have to take eliquis for a prolonged amount of time. Please discuss this with your primary care doctor. We recommend that you also follow up with [facility] at [facility] to ensure that nothing further needs to be done for your history of tumors.   Medication: Please take Eliquis 5 mg (2 tablets in the morning (10 mg total) and 2 tablets in the evening (10 mg total)) for 6 days followed by Eliquis 5 mg (1 tablet in the morning (5 mg total) and 1 tablet in the evening (5 mg  total)) from there on  Follow-up: - Please follow up with your primary care provider Mckinley Jewel, MD at [facility] (please call to schedule an appointment) Phone: [phone] Address: [address], [place], [place], [place] [address]

## 2022-04-26 NOTE — ED Notes (Signed)
ED TO INPATIENT HANDOFF REPORT  ED Nurse Name and Phone #:   Adyen Bifulco [phone]  S Name/Age/Gender Cheryl Herman 82 y.o. female Room/Bed: 042C/042C  Code Status   Code Status: Full Code  Home/SNF/Other Home Patient oriented to: self, place, time, and situation Is this baseline? Yes   Triage Complete: Triage complete  Chief Complaint Pulmonary embolism [facility]) [I26.99]  Triage Note Pt sent from her doctor after having +PE on CTAP that was done as a recheck for renal cell carcinoma. Pt endorses mild sob and chest pain that she thinks is indigestion.    Allergies No Known Allergies  Level of Care/Admitting Diagnosis ED Disposition     ED Disposition  Admit   Condition  --   Comment  Hospital Area: [facility] [place] [[id number]]  Level of Care: Telemetry Medical [104]  May admit patient to [facility] or [facility] if equivalent level of care is available:: Yes  Covid Evaluation: Asymptomatic - no recent exposure (last 10 days) testing not required  Diagnosis: Pulmonary embolism [facility]) [id number]  Admitting Physician: Charise Killian [id number]  Attending Physician: Charise Killian [id number]  Certification:: I certify this patient will need inpatient services for at least 2 midnights  Estimated Length of Stay: 3          B Medical/Surgery History Past Medical History:  Diagnosis Date   Arthritis    oa   GERD (gastroesophageal reflux disease)    Hypertension    Hypothyroidism    Past Surgical History:  Procedure Laterality Date   ABDOMINAL HYSTERECTOMY     partial   COLONOSCOPY WITH PROPOFOL N/A 10/27/2013   Procedure: COLONOSCOPY WITH PROPOFOL;  Surgeon: Garlan Fair, MD;  Location: [facility] ENDOSCOPY;  Service: Endoscopy;  Laterality: N/A;   ESOPHAGOGASTRODUODENOSCOPY (EGD) WITH PROPOFOL N/A 10/27/2013   Procedure: ESOPHAGOGASTRODUODENOSCOPY (EGD) WITH PROPOFOL;  Surgeon: Garlan Fair, MD;  Location: [facility] ENDOSCOPY;  Service: Endoscopy;  Laterality: N/A;    partial thryoidectomy  yrs ago     A IV Location/Drains/Wounds Patient Lines/Drains/Airways Status     Active Line/Drains/Airways     Name Placement date Placement time Site Days   Peripheral IV 04/25/22 22 G Left;Posterior Hand 04/25/22  1650  Hand  1   Peripheral IV 04/25/22 20 G Left;Posterior Forearm 04/25/22  1636  Forearm  1   External Urinary Catheter 04/25/22  1938  --  1   Incision (Closed) 09/06/15 Flank Right 09/06/15  0847  -- 2424   Wound / Incision (Open or Dehisced) 04/27/20 Puncture Neck Right Biopsy site 04/27/20  1600  Neck  729            Intake/Output Last 24 hours  Intake/Output Summary (Last 24 hours) at 04/26/2022 1320 Last data filed at 04/26/2022 1236 Gross per 24 hour  Intake 560.43 ml  Output 1000 ml  Net -439.57 ml    Labs/Imaging Results for orders placed or performed during the hospital encounter of 04/25/22 (from the past 48 hour(s))  CBC with Differential     Status: None   Collection Time: 04/25/22  1:10 PM  Result Value Ref Range   WBC 7.0 4.0 - 10.5 K/uL   RBC 4.42 3.87 - 5.11 MIL/uL   Hemoglobin 13.9 12.0 - 15.0 g/dL   HCT 43.6 36.0 - 46.0 %   MCV 98.6 80.0 - 100.0 fL   MCH 31.4 26.0 - 34.0 pg   MCHC 31.9 30.0 - 36.0 g/dL   RDW 12.9 11.5 - 15.5 %  Platelets 259 150 - 400 K/uL   nRBC 0.0 0.0 - 0.2 %   Neutrophils Relative % 62 %   Neutro Abs 4.3 1.7 - 7.7 K/uL   Lymphocytes Relative 32 %   Lymphs Abs 2.3 0.7 - 4.0 K/uL   Monocytes Relative 6 %   Monocytes Absolute 0.4 0.1 - 1.0 K/uL   Eosinophils Relative 0 %   Eosinophils Absolute 0.0 0.0 - 0.5 K/uL   Basophils Relative 0 %   Basophils Absolute 0.0 0.0 - 0.1 K/uL   Immature Granulocytes 0 %   Abs Immature Granulocytes 0.02 0.00 - 0.07 K/uL    Comment: Performed at [facility] [address], [place], [place] [id number]  Basic metabolic panel     Status: Abnormal   Collection Time: 04/25/22  1:10 PM  Result Value Ref Range   Sodium 136 135 - 145 mmol/L    Potassium 3.3 (L) 3.5 - 5.1 mmol/L   Chloride 94 (L) 98 - 111 mmol/L   CO2 32 22 - 32 mmol/L   Glucose, Bld 121 (H) 70 - 99 mg/dL    Comment: Glucose reference range applies only to samples taken after fasting for at least 8 hours.   BUN 11 8 - 23 mg/dL   Creatinine, Ser 0.85 0.44 - 1.00 mg/dL   Calcium 9.5 8.9 - 10.3 mg/dL   GFR, Estimated >60 >60 mL/min    Comment: (NOTE) Calculated using the CKD-EPI Creatinine Equation (2021)    Anion gap 10 5 - 15    Comment: Performed at [facility] [address], [place], [place] [address]  Brain natriuretic peptide     Status: None   Collection Time: 04/25/22  1:10 PM  Result Value Ref Range   B Natriuretic Peptide 32.8 0.0 - 100.0 pg/mL    Comment: Performed at [facility] [address] [place], [place] [address]  Troponin I (High Sensitivity)     Status: None   Collection Time: 04/25/22  1:25 PM  Result Value Ref Range   Troponin I (High Sensitivity) 4 <18 ng/L    Comment: (NOTE) Elevated high sensitivity troponin I (hsTnI) values and significant  changes across serial measurements may suggest ACS but many other  chronic and acute conditions are known to elevate hsTnI results.  Refer to the "Links" section for chest pain algorithms and additional  guidance. Performed at [facility], [place] [address], [place], [place] [address]   Troponin I (High Sensitivity)     Status: None   Collection Time: 04/25/22  4:38 PM  Result Value Ref Range   Troponin I (High Sensitivity) 5 <18 ng/L    Comment: (NOTE) Elevated high sensitivity troponin I (hsTnI) values and significant  changes across serial measurements may suggest ACS but many other  chronic and acute conditions are known to elevate hsTnI results.  Refer to the "Links" section for chest pain algorithms and additional  guidance. Performed at [facility], [place] [address] [place], [place] [address]   TSH     Status: None   Collection Time: 04/25/22  4:38  PM  Result Value Ref Range   TSH 0.517 0.350 - 4.500 uIU/mL    Comment: Performed by a 3rd Generation assay with a functional sensitivity of <=0.01 uIU/mL. Performed at [facility], [place] [address] [place], [place] [address]   Heparin level (unfractionated)     Status: Abnormal   Collection Time: 04/26/22  12:50 AM  Result Value Ref Range   Heparin Unfractionated >1.10 (H) 0.30 - 0.70 IU/mL    Comment: (NOTE) The clinical reportable range upper limit is being lowered to >1.10 to align with the FDA approved guidance for the current laboratory assay.  If heparin results are below expected values, and patient dosage has  been confirmed, suggest follow up testing of antithrombin III levels. Performed at [facility], [place] [address] [place], [place] [id number]   Basic metabolic panel     Status: Abnormal   Collection Time: 04/26/22  4:48 AM  Result Value Ref Range   Sodium 139 135 - 145 mmol/L   Potassium 4.1 3.5 - 5.1 mmol/L   Chloride 98 98 - 111 mmol/L   CO2 29 22 - 32 mmol/L   Glucose, Bld 102 (H) 70 - 99 mg/dL    Comment: Glucose reference range applies only to samples taken after fasting for at least 8 hours.   BUN 14 8 - 23 mg/dL   Creatinine, Ser 0.80 0.44 - 1.00 mg/dL   Calcium 8.8 (L) 8.9 - 10.3 mg/dL   GFR, Estimated >60 >60 mL/min    Comment: (NOTE) Calculated using the CKD-EPI Creatinine Equation (2021)    Anion gap 12 5 - 15    Comment: Performed at [facility] [address] [place], [place] [address]  Magnesium     Status: None   Collection Time: 04/26/22  4:48 AM  Result Value Ref Range   Magnesium 1.8 1.7 - 2.4 mg/dL    Comment: Performed at [facility] [address] [place], [place] [address]  Heparin level (unfractionated)     Status: Abnormal   Collection Time: 04/26/22  9:45 AM  Result Value Ref Range   Heparin Unfractionated 0.90 (H) 0.30 - 0.70 IU/mL    Comment: (NOTE) The clinical reportable range upper limit is being  lowered to >1.10 to align with the FDA approved guidance for the current laboratory assay.  If heparin results are below expected values, and patient dosage has  been confirmed, suggest follow up testing of antithrombin III levels. Performed at [facility], [place] [address], [place],  [address]    VAS [place] LOWER EXTREMITY VENOUS (DVT) (7a-7p)  Result Date: 04/25/2022  Lower Venous DVT Study Patient Name:  Cheryl Herman [facility]  Date of Exam:   04/25/2022 Medical Rec #: [id number]          Accession #:    [id number] Date of Birth: 07/05/1940          Patient Gender: F Patient Age:   28 years Exam Location:  [facility] Procedure:      VAS [place] LOWER EXTREMITY VENOUS (DVT) Referring Phys: HALEY SAGE --------------------------------------------------------------------------------  Indications: Pain, SOB, and Cramping.  Risk Factors: Confirmed PE. Comparison Study: No prior study. Performing Technologist: McKayla Maag RVT, [place]  Examination Guidelines: A complete evaluation includes B-mode imaging, spectral Doppler, color Doppler, and power Doppler as needed of all accessible portions of each vessel. Bilateral testing is considered an integral part of a complete examination. Limited examinations for reoccurring indications may be performed as noted. The reflux portion of the exam is performed with the patient in reverse Trendelenburg.  +---------+---------------+---------+-----------+----------+--------------+ RIGHT    CompressibilityPhasicitySpontaneityPropertiesThrombus Aging +---------+---------------+---------+-----------+----------+--------------+ CFV      Full                                                        +---------+---------------+---------+-----------+----------+--------------+  SFJ      Full                                                        +---------+---------------+---------+-----------+----------+--------------+ FV Prox  Full                                                         +---------+---------------+---------+-----------+----------+--------------+ FV Mid   Full                                                        +---------+---------------+---------+-----------+----------+--------------+ FV DistalFull                                                        +---------+---------------+---------+-----------+----------+--------------+ PFV      Full                                                        +---------+---------------+---------+-----------+----------+--------------+ POP      Full                                                        +---------+---------------+---------+-----------+----------+--------------+ PTV      Full                                                        +---------+---------------+---------+-----------+----------+--------------+ PERO     Full                                                        +---------+---------------+---------+-----------+----------+--------------+ SSV      None           No       No                   Acute          +---------+---------------+---------+-----------+----------+--------------+   +----+---------------+---------+-----------+----------+--------------+ LEFTCompressibilityPhasicitySpontaneityPropertiesThrombus Aging +----+---------------+---------+-----------+----------+--------------+ CFV Full           Yes      Yes                                 +----+---------------+---------+-----------+----------+--------------+  SFJ Full                                                        +----+---------------+---------+-----------+----------+--------------+    Summary: RIGHT: - Findings consistent with acute superficial vein thrombosis involving the right small saphenous vein. - There is no evidence of deep vein thrombosis in the lower extremity.  - No cystic structure found in the popliteal fossa.  LEFT: - No evidence of common  femoral vein obstruction.  *See table(s) above for measurements and observations. Electronically signed by Deitra Mayo MD on 04/25/2022 at 7:33:52 PM.    Final     Pending Labs Unresulted Labs (From admission, onward)     Start     Ordered   04/27/22 0500  CBC  Daily,   R      04/26/22 1102            Vitals/Pain Today's Vitals   04/26/22 0906 04/26/22 1000 04/26/22 1200 04/26/22 1202  BP:  (!) 154/76 (!) 142/79   Pulse:  69 68   Resp:  20 (!) 21   Temp:   98.3 F (36.8 C)   TempSrc:      SpO2:  97% 98%   Weight:      Height:      PainSc: 0-No pain  0-No pain 0-No pain    Isolation Precautions No active isolations  Medications Medications  acetaminophen (TYLENOL) tablet 650 mg (650 mg Oral Given 04/25/22 2155)    Or  acetaminophen (TYLENOL) suppository 650 mg ( Rectal See Alternative 04/25/22 2155)  polyethylene glycol (MIRALAX / GLYCOLAX) packet 17 g (has no administration in time range)  hydrochlorothiazide (HYDRODIURIL) tablet 25 mg (25 mg Oral Not Given 04/26/22 1004)  levothyroxine (SYNTHROID) tablet 75 mcg (75 mcg Oral Given 04/26/22 0752)  metoprolol succinate (TOPROL-XL) 24 hr tablet 50 mg (50 mg Oral Not Given 04/26/22 1005)  diclofenac Sodium (VOLTAREN) 1 % topical gel 4 g (4 g Topical Given 04/26/22 1047)  pantoprazole (PROTONIX) EC tablet 40 mg (40 mg Oral Given 04/26/22 1047)  apixaban (ELIQUIS) tablet 10 mg (10 mg Oral Given 04/26/22 1236)    Followed by  apixaban (ELIQUIS) tablet 5 mg (has no administration in time range)  heparin bolus via infusion 4,500 Units (4,500 Units Intravenous Bolus from Bag 04/25/22 1655)  potassium chloride (KLOR-CON) packet 40 mEq (40 mEq Oral Given 04/25/22 2301)    Mobility walks     Focused Assessments Cardiac Assessment Handoff:    No results found for: "CKTOTAL", "CKMB", "CKMBINDEX", "TROPONINI" No results found for: "DDIMER" Does the Patient currently have chest pain? No   , Pulmonary Assessment  Handoff:  Lung sounds:   O2 Device: Room Air      R Recommendations: See Admitting Provider Note  Report given to:   Additional Notes:  heparin gtt discontinued,  started on Xaralto.   Was Using purwick while on drip, pt is ambulatory.

## 2022-04-26 NOTE — TOC Benefit Eligibility Note (Signed)
Patient Advocate Encounter  Insurance verification completed.    The patient is currently admitted and upon discharge could be taking Eliquis 5 mg.  The current 30 day co-pay is $47.00.   The patient is insured through [employer] Medicare Part D   Lamarion Mcevers, CPHT Pharmacy Patient Advocate Specialist [facility] Patient Advocate Team Direct Number: [phone]  Fax: [phone]       

## 2022-04-26 NOTE — Progress Notes (Addendum)
[place] for heparin >> Apixaban Indication: pulmonary embolus  No Known Allergies  Patient Measurements: Height: '5\' 5"'$  (165.1 cm) Weight: 63.5 kg (140 lb) IBW/kg (Calculated) : 57 Heparin Dosing Weight: 70kg  Vital Signs: Temp: 98.2 F (36.8 C) (02/28 0800) Temp Source: Oral (02/28 0800) BP: 154/76 (02/28 1000) Pulse Rate: 69 (02/28 1000)  Labs: Recent Labs    04/25/22 1310 04/25/22 1325 04/25/22 1638 04/26/22 0050 04/26/22 0448 04/26/22 0945  HGB 13.9  --   --   --   --   --   HCT 43.6  --   --   --   --   --   PLT 259  --   --   --   --   --   HEPARINUNFRC  --   --   --  >1.10*  --  0.90*  CREATININE 0.85  --   --   --  0.80  --   TROPONINIHS  --  4 5  --   --   --     Estimated Creatinine Clearance: 47.9 mL/min (by C-G formula based on SCr of 0.8 mg/dL).   Medical History: Past Medical History:  Diagnosis Date   Arthritis    oa   GERD (gastroesophageal reflux disease)    Hypertension    Hypothyroidism     Assessment: 3 YOF presenting with SOB, CP, outpt CT with PE, doppler here with superficial vein thrombus. She is not on anticoagulation PTA, CBC wnl  Heparin level 0.9 is still supratherapeutic after holding 1 hr then restarting 950 units/hr. No issues with infusion or bleeding. Asked RN to hold heparin for 1 hour.   Goals of Therapy:  Heparin level 0.3-0.7 units/ml Monitor platelets by anticoagulation protocol: Yes   Plan:  Hold heparin for 1 hour then decrease to 800 units/hr F/u heparin level in 6 hr Monitor daily heparin level, CBC Monitor for signs/symptoms of bleeding  F/u transition to apixaban  ADDENDUM 12:00: pharmacy consulted to start apixaban. Stop heparin and start apixaban '10mg'$  BID x 7 days then '5mg'$  BID. Education completed with patient and daughter.  Benetta Spar, PharmD, BCPS, BCCP Clinical Pharmacist  Please check AMION for all [place] phone numbers After 10:00 PM, call [place] [phone]

## 2022-04-27 ENCOUNTER — Inpatient Hospital Stay (HOSPITAL_COMMUNITY): Payer: Medicare Other

## 2022-04-27 ENCOUNTER — Other Ambulatory Visit (HOSPITAL_COMMUNITY): Payer: Self-pay

## 2022-04-27 DIAGNOSIS — I2609 Other pulmonary embolism with acute cor pulmonale: Secondary | ICD-10-CM | POA: Diagnosis not present

## 2022-04-27 LAB — ECHOCARDIOGRAM COMPLETE
AR max vel: 3.25 cm2
AV Area VTI: 3.22 cm2
AV Area mean vel: 3.06 cm2
AV Mean grad: 3.5 mmHg
AV Peak grad: 7 mmHg
Ao pk vel: 1.33 m/s
Area-P 1/2: 2.76 cm2
Height: 65 in
S' Lateral: 2.3 cm
Weight: 2240 oz

## 2022-04-27 LAB — CBC
HCT: 41.5 % (ref 36.0–46.0)
Hemoglobin: 13.5 g/dL (ref 12.0–15.0)
MCH: 31.3 pg (ref 26.0–34.0)
MCHC: 32.5 g/dL (ref 30.0–36.0)
MCV: 96.3 fL (ref 80.0–100.0)
Platelets: 229 10*3/uL (ref 150–400)
RBC: 4.31 MIL/uL (ref 3.87–5.11)
RDW: 13 % (ref 11.5–15.5)
WBC: 8.9 10*3/uL (ref 4.0–10.5)
nRBC: 0 % (ref 0.0–0.2)

## 2022-04-27 MED ORDER — APIXABAN 5 MG PO TABS
ORAL_TABLET | ORAL | 0 refills | Status: AC
  Filled 2022-04-27: qty 72, 30d supply, fill #0

## 2022-04-27 MED ORDER — PANTOPRAZOLE SODIUM 40 MG PO TBEC
40.0000 mg | DELAYED_RELEASE_TABLET | Freq: Two times a day (BID) | ORAL | 0 refills | Status: AC
  Filled 2022-04-27: qty 60, 30d supply, fill #0

## 2022-04-27 NOTE — Discharge Summary (Signed)
Name: Cheryl Herman MRN: [id number] DOB: 1940/10/12 82 y.o. PCP: Mckinley Jewel, MD  Date of Admission: 04/25/2022  1:02 PM Date of Discharge: 04/27/2022 Attending Physician: Dr.  Charise Killian  Discharge Diagnosis: Principal Problem:   Pulmonary embolism [facility]) Active Problems:   Right Renal Oncocytoma   Right Parotid Gland Oncocytoma   Leg cramping   OSA on CPAP   Hypothyroidism s/p partial thyroidectomy   HTN   GERD  Discharge Medications: Allergies as of 04/27/2022   No Known Allergies      Medication List     STOP taking these medications    omeprazole 40 MG capsule Commonly known as: PRILOSEC       TAKE these medications    acetaminophen 650 MG CR tablet Commonly known as: TYLENOL Take 650 mg by mouth every 8 (eight) hours as needed for pain.   apixaban 5 MG Tabs tablet Commonly known as: ELIQUIS Take 2 tablets (10 mg total) by mouth 2 (two) times daily for 6 days, THEN 1 tablet (5 mg total) 2 (two) times daily for 24 days. Start taking on: April 27, 2022   atorvastatin 10 MG tablet Commonly known as: LIPITOR Take 1 tablet by mouth every other day.   B-complex with vitamin C tablet Take 1 tablet by mouth daily.   CENTRUM SILVER 50+WOMEN PO Take 1 tablet by mouth daily.   cholecalciferol 1000 units tablet Commonly known as: VITAMIN D Take 1,000 Units by mouth daily.   hydrochlorothiazide 25 MG tablet Commonly known as: HYDRODIURIL Take 25 mg by mouth daily.   levothyroxine 75 MCG tablet Commonly known as: SYNTHROID Take 75 mcg by mouth daily before breakfast.   MAGNESIUM PO Take 2 tablets by mouth at bedtime.   metoprolol succinate 50 MG 24 hr tablet Commonly known as: TOPROL-XL TAKE AS DIRECTED ONCE DAILY 90 DAYS   NIFEdipine 30 MG 24 hr tablet Commonly known as: ADALAT CC Take 30 mg by mouth daily.   OSTEO BI-FLEX JOINT SHIELD PO Take 2 tablets by mouth daily.   pantoprazole 40 MG tablet Commonly known as: PROTONIX Take  1 tablet (40 mg total) by mouth 2 (two) times daily.   POTASSIUM PO Take 1 tablet by mouth at bedtime.   sodium chloride 0.65 % Soln nasal spray Commonly known as: OCEAN Place 1 spray into both nostrils as needed for congestion.        Disposition and follow-up:   Ms.Cheryl Herman was discharged from [facility] in Stable condition.  At the hospital follow up visit please address:  1.  Follow-up:  a. Pulmonary Embolism  - Started on eliquis, may be provoked in the setting of prior renal/parotid gland oncocytoma     b.  Right Renal Oncocytoma,    Right Parotid Gland Oncocytoma  - Previously cleared by physician at [facility], recommend repeat f/u given new pulmonary embolism  2.  Labs / imaging needed at time of follow-up: none    3.  Pending labs/ test needing follow-up: none  Follow-up Appointments: - Please follow up with your primary care provider Mckinley Jewel, MD at [facility] (please call to schedule an appointment) Phone: [phone] Address: [address], [place], [place], [place] [address]  Hospital Course by problem list:  Cheryl Herman is a 82 y.o. with pertinent PMH of right renal oncocytoma, hypertension, hypothyroidism, GERD, OSA on CPAP, urge incontinence, chronic back pain, and detached retina  who presented with chest pain and shortness of breath for past 2-3 weeks and was admitted for pulmonary embolism on 04/25/22.   #PE Patient suspected intermittent chest pain was secondary to GERD or gas. Has had this pain intermittently since 01/2022. Not much trouble breathing at home. PE noted incidentally on outside CTA. We originally did not have any imaging within our database to corroborate this. On 04/26/22 we obtained copy of CT scan report from [facility] done on 04/25/22. CT report shows linear filling defect in the proximal right lower lobe pulmonary artery consistent with acute or  subacute pulmonary embolism (scanned into media tab). Transitioned from heparin drip to oral Eliquis 2/28. No signs of right heart strain on physical exam. No hypotension, tachycardia, or increased oxygen demand. BNP and troponins WNL. TTE w/o signs of R heart strain. Given history of oncocytoma, there is concern for provoked PE, but will need to be reassessed by oncology for further clarification. Previously seen by oncology office ([facility]). Was cleared by prior oncologist but we recommend repeat f/u given new pulmonary embolism.    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 Patient was followed by Dr. Alen Blew. Per oncology note in 04/2020, [place] of neck mass was performed demonstrating mass in the region of the right parotid gland. 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 No surgical intervention, chemotherapy, or radiation were planned for these neoplasms. Patient was to have active surveillance of her renal mass. She has not followed up with oncology since then as her prior oncologist retired and was not planning on intervention. Given new pulmonary embolism, recommend new f/u visit at [facility] for monitoring of these findings.    #Leg cramps Presented w/ leg cramps worse on the right side that primarily occur at night. Had mild hypokalemia on admission but did not resolve after repletion. DVT study demonstrates acute superficial vein thrombosis involving the right small saphenous vein w/o DVT. SX resolved after patient was given 1x dose of Robaxin and heating pad. Recommend reassessment in clinic.    #Hypokalemia - resolved Repleted. Potassium normalized.   #OSA on CPAP Continue CPAP nightly at home.     #Hypothyroidism s/p partial thyroidectomy TSH WNL. Continued home Synthroid 75 mcg throughout her hospital stay and at discharge.   #HTN Continued home hydrochlorothiazide 25 mg daily throughout her hospitalization and at discharge.  Discharge Exam:   BP (!) 146/68   Pulse 75   Temp 98.2 F (36.8 C) (Oral)   Resp (!) 23   Ht '5\' 5"'$  (1.651 m)   Wt 63.5 kg   SpO2 94%   BMI 23.30 kg/m  Constitutional: well-appearing, appears as stated age woman sitting in bed, in no acute distress HENT: normocephalic atraumatic, mucous membranes moist Eyes: conjunctiva non-erythematous Neck: painless, mobile lump present on the right lateral neck. Rest of the neck is supple Cardiovascular: regular rate and rhythm, no m/r/g Pulmonary/Chest: normal work of breathing on room air, lungs clear to auscultation bilaterally Abdominal: soft, non-tender, non-distended MSK: normal bulk and tone Neurological: alert & oriented x 3, non focal Skin: warm and dry Psych: normal mood and affect   Pertinent Labs, Studies, and Procedures:     Latest Ref Rng & Units 04/27/2022    4:18  AM 04/25/2022    1:10 PM 02/21/2021    9:04 AM  CBC  WBC 4.0 - 10.5 K/uL 8.9  7.0  6.2   Hemoglobin 12.0 - 15.0 g/dL 13.5  13.9  13.4   Hematocrit 36.0 - 46.0 % 41.5  43.6  41.4   Platelets 150 - 400 K/uL 229  259  266        Latest Ref Rng & Units 04/26/2022    4:48 AM 04/25/2022    1:10 PM 02/21/2021    9:04 AM  CMP  Glucose 70 - 99 mg/dL 102  121  92   BUN 8 - 23 mg/dL '14  11  15   '$ Creatinine 0.44 - 1.00 mg/dL 0.80  0.85  0.77   Sodium 135 - 145 mmol/L 139  136  139   Potassium 3.5 - 5.1 mmol/L 4.1  3.3  3.6   Chloride 98 - 111 mmol/L 98  94  99   CO2 22 - 32 mmol/L 29  32  32   Calcium 8.9 - 10.3 mg/dL 8.8  9.5  9.3   Total Protein 6.5 - 8.1 g/dL   6.4   Total Bilirubin 0.3 - 1.2 mg/dL   0.7   Alkaline Phos 38 - 126 U/L   39   AST 15 - 41 U/L   35   ALT 0 - 44 U/L   21     VAS [place] LOWER EXTREMITY VENOUS  Summary:   RIGHT:  - Findings consistent with acute superficial vein thrombosis involving the  right small saphenous vein.  - There is no evidence of deep vein thrombosis in the lower extremity.    - No cystic structure found in the popliteal fossa.    LEFT:  - No evidence of common femoral vein obstruction.    On 04/25/2022 at 7:33:52 PM.   ECHOCARDIOGRAM COMPLETE  IMPRESSIONS     1. Left ventricular ejection fraction, by estimation, is 60 to 65%. The  left ventricle has normal function. The left ventricle has no regional  wall motion abnormalities. There is mild concentric left ventricular  hypertrophy. Left ventricular diastolic  parameters are consistent with Grade I diastolic dysfunction (impaired  relaxation).   2. Right ventricular systolic function is normal. The right ventricular  size is normal.   3. The mitral valve is normal in structure. No evidence of mitral valve  regurgitation. No evidence of mitral stenosis.   4. The aortic valve is normal in structure. Aortic valve regurgitation is  not visualized. No aortic stenosis is present.   5. The inferior vena cava is normal in size with greater than 50%  respiratory variability, suggesting right atrial pressure of 3 mmHg.  Date of Exam: 04/27/2022    Discharge Instructions:  You were hospitalized for a blood clot in your lungs (pulmonary embolism).    Hospital Course: We found a blood clot in your lungs on your CT scan. We started you on a blood thinner (eliquis). Given your history of tumors (kidney, parotid gland), we are suspicious that your blood clot may have been related to these conditions. Therefore, it is possible that you will have to take eliquis for a prolonged amount of time. Please discuss this with your primary care doctor. We recommend that you also follow up with [facility] to ensure that nothing further needs to be done for your history of tumors.   Medication: Please take Eliquis  5 mg (2 tablets  in the morning (10 mg total) and 2 tablets in the evening (10 mg total)) for 6 days followed by Eliquis 5 mg (1 tablet in the morning (5 mg total) and 1 tablet in the evening (5 mg total)) from there on   Follow-up: - Please follow up with your primary care provider Mckinley Jewel, MD at [facility] (please call to schedule an appointment) Phone: [phone] Address: [address] [place], [place], [place] [address]   Signed: Starlyn Skeans, MD 04/27/2022, 1:39 PM   Pager: [phone]  Attestation for Student Documentation:  I personally was present and performed or re-performed the history, physical exam and medical decision-making activities of this service and have verified that the service and findings are accurately documented in the student's note.  Starlyn Skeans, MD 04/27/2022, 1:39 PM

## 2022-04-27 NOTE — Progress Notes (Signed)
Echocardiogram 2D Echocardiogram has been performed.  Cheryl Herman 04/27/2022, 3:16 PM

## 2022-04-27 NOTE — Progress Notes (Signed)
1245: echo pending, scheduled for 1000 but has yet to be preformed. Placed call for update no answer.

## 2022-04-27 NOTE — Progress Notes (Signed)
Mobility Specialist Progress Note    04/27/22 1404  Mobility  Activity Ambulated with assistance in hallway  Level of Assistance Contact guard assist, steadying assist  Assistive Device Front wheel walker  Distance Ambulated (ft) 400 ft  Activity Response Tolerated well  Mobility Referral Yes  $Mobility charge 1 Mobility   Pre-Mobility: 80 HR, 97% SpO2 During Mobility: 104 HR Post-Mobility: 79 HR, 95% SpO2  Pt received in bed and agreeable. No complaints on walk. Had void in BR. Returned to bed with call bell in reach.   Hildred Alamin Mobility Specialist  Please Psychologist, sport and exercise or Rehab Office at [phone]

## 2022-04-27 NOTE — TOC Transition Note (Signed)
Transition of Care [facility]) - CM/SW Discharge Note   Patient Details  Name: SHELBE AMBORN MRN: [id number] Date of Birth: [birth year]  Transition of Care [facility]) CM/SW Contact:  Verdell Carmine, RN Phone Number: 04/27/2022, 4:42 PM   Clinical Narrative:     Patient discharging today, ambulating well with mobility tech. Eligibility done for eliquis.  CM signing off. Please consult for any new issue.  Final next level of care: Home/Self Care Barriers to Discharge: No Barriers Identified   Patient Goals and CMS Choice      Discharge Placement                         Discharge Plan and Services Additional resources added to the After Visit Summary for                                       Social Determinants of Health (SDOH) Interventions SDOH Screenings   Food Insecurity: No Food Insecurity (04/26/2022)  Housing: Low Risk  (04/26/2022)  Transportation Needs: No Transportation Needs (04/26/2022)  Utilities: Not At Risk (04/26/2022)  Tobacco Use: Low Risk  (04/26/2022)     Readmission Risk Interventions     No data to display

## 2022-05-02 DIAGNOSIS — N2889 Other specified disorders of kidney and ureter: Secondary | ICD-10-CM | POA: Diagnosis not present

## 2022-05-02 DIAGNOSIS — I2699 Other pulmonary embolism without acute cor pulmonale: Secondary | ICD-10-CM | POA: Diagnosis not present

## 2022-05-02 DIAGNOSIS — I82811 Embolism and thrombosis of superficial veins of right lower extremities: Secondary | ICD-10-CM | POA: Diagnosis not present

## 2022-05-02 DIAGNOSIS — D490 Neoplasm of unspecified behavior of digestive system: Secondary | ICD-10-CM | POA: Diagnosis not present

## 2022-05-02 DIAGNOSIS — I129 Hypertensive chronic kidney disease with stage 1 through stage 4 chronic kidney disease, or unspecified chronic kidney disease: Secondary | ICD-10-CM | POA: Diagnosis not present

## 2022-05-02 DIAGNOSIS — E039 Hypothyroidism, unspecified: Secondary | ICD-10-CM | POA: Diagnosis not present

## 2022-05-02 DIAGNOSIS — R1319 Other dysphagia: Secondary | ICD-10-CM | POA: Diagnosis not present

## 2022-05-08 DIAGNOSIS — G4733 Obstructive sleep apnea (adult) (pediatric): Secondary | ICD-10-CM | POA: Diagnosis not present

## 2022-05-22 ENCOUNTER — Other Ambulatory Visit (HOSPITAL_COMMUNITY): Payer: Self-pay

## 2022-06-07 DIAGNOSIS — H53411 Scotoma involving central area, right eye: Secondary | ICD-10-CM | POA: Diagnosis not present

## 2022-06-07 DIAGNOSIS — H338 Other retinal detachments: Secondary | ICD-10-CM | POA: Diagnosis not present

## 2022-06-07 DIAGNOSIS — G4733 Obstructive sleep apnea (adult) (pediatric): Secondary | ICD-10-CM | POA: Diagnosis not present

## 2022-06-07 DIAGNOSIS — Z961 Presence of intraocular lens: Secondary | ICD-10-CM | POA: Diagnosis not present

## 2022-06-07 DIAGNOSIS — H35362 Drusen (degenerative) of macula, left eye: Secondary | ICD-10-CM | POA: Diagnosis not present

## 2022-06-07 DIAGNOSIS — H43392 Other vitreous opacities, left eye: Secondary | ICD-10-CM | POA: Diagnosis not present

## 2022-06-07 DIAGNOSIS — H31091 Other chorioretinal scars, right eye: Secondary | ICD-10-CM | POA: Diagnosis not present

## 2022-06-07 DIAGNOSIS — H35371 Puckering of macula, right eye: Secondary | ICD-10-CM | POA: Diagnosis not present

## 2022-06-07 DIAGNOSIS — H34831 Tributary (branch) retinal vein occlusion, right eye, with macular edema: Secondary | ICD-10-CM | POA: Diagnosis not present

## 2022-06-08 DIAGNOSIS — G4733 Obstructive sleep apnea (adult) (pediatric): Secondary | ICD-10-CM | POA: Diagnosis not present

## 2022-06-14 DIAGNOSIS — H348112 Central retinal vein occlusion, right eye, stable: Secondary | ICD-10-CM | POA: Diagnosis not present

## 2022-06-14 DIAGNOSIS — H31091 Other chorioretinal scars, right eye: Secondary | ICD-10-CM | POA: Diagnosis not present

## 2022-06-14 DIAGNOSIS — H21501 Unspecified adhesions of iris, right eye: Secondary | ICD-10-CM | POA: Diagnosis not present

## 2022-06-14 DIAGNOSIS — H02831 Dermatochalasis of right upper eyelid: Secondary | ICD-10-CM | POA: Diagnosis not present

## 2022-06-14 DIAGNOSIS — H353111 Nonexudative age-related macular degeneration, right eye, early dry stage: Secondary | ICD-10-CM | POA: Diagnosis not present

## 2022-06-14 DIAGNOSIS — H5231 Anisometropia: Secondary | ICD-10-CM | POA: Diagnosis not present

## 2022-06-14 DIAGNOSIS — H02834 Dermatochalasis of left upper eyelid: Secondary | ICD-10-CM | POA: Diagnosis not present

## 2022-06-14 DIAGNOSIS — H04123 Dry eye syndrome of bilateral lacrimal glands: Secondary | ICD-10-CM | POA: Diagnosis not present

## 2022-06-14 DIAGNOSIS — H538 Other visual disturbances: Secondary | ICD-10-CM | POA: Diagnosis not present

## 2022-07-08 DIAGNOSIS — G4733 Obstructive sleep apnea (adult) (pediatric): Secondary | ICD-10-CM | POA: Diagnosis not present

## 2022-07-10 DIAGNOSIS — M461 Sacroiliitis, not elsewhere classified: Secondary | ICD-10-CM | POA: Diagnosis not present

## 2022-07-19 DIAGNOSIS — R1314 Dysphagia, pharyngoesophageal phase: Secondary | ICD-10-CM | POA: Diagnosis not present

## 2022-07-19 DIAGNOSIS — K224 Dyskinesia of esophagus: Secondary | ICD-10-CM | POA: Diagnosis not present

## 2022-08-02 DIAGNOSIS — H53411 Scotoma involving central area, right eye: Secondary | ICD-10-CM | POA: Diagnosis not present

## 2022-08-02 DIAGNOSIS — Z961 Presence of intraocular lens: Secondary | ICD-10-CM | POA: Diagnosis not present

## 2022-08-02 DIAGNOSIS — Z9989 Dependence on other enabling machines and devices: Secondary | ICD-10-CM | POA: Diagnosis not present

## 2022-08-02 DIAGNOSIS — H34831 Tributary (branch) retinal vein occlusion, right eye, with macular edema: Secondary | ICD-10-CM | POA: Diagnosis not present

## 2022-08-02 DIAGNOSIS — H35371 Puckering of macula, right eye: Secondary | ICD-10-CM | POA: Diagnosis not present

## 2022-08-02 DIAGNOSIS — H31091 Other chorioretinal scars, right eye: Secondary | ICD-10-CM | POA: Diagnosis not present

## 2022-08-02 DIAGNOSIS — I2699 Other pulmonary embolism without acute cor pulmonale: Secondary | ICD-10-CM | POA: Diagnosis not present

## 2022-08-02 DIAGNOSIS — Z86711 Personal history of pulmonary embolism: Secondary | ICD-10-CM | POA: Diagnosis not present

## 2022-08-02 DIAGNOSIS — Z0000 Encounter for general adult medical examination without abnormal findings: Secondary | ICD-10-CM | POA: Diagnosis not present

## 2022-08-02 DIAGNOSIS — E039 Hypothyroidism, unspecified: Secondary | ICD-10-CM | POA: Diagnosis not present

## 2022-08-02 DIAGNOSIS — N183 Chronic kidney disease, stage 3 unspecified: Secondary | ICD-10-CM | POA: Diagnosis not present

## 2022-08-02 DIAGNOSIS — E7800 Pure hypercholesterolemia, unspecified: Secondary | ICD-10-CM | POA: Diagnosis not present

## 2022-08-02 DIAGNOSIS — I129 Hypertensive chronic kidney disease with stage 1 through stage 4 chronic kidney disease, or unspecified chronic kidney disease: Secondary | ICD-10-CM | POA: Diagnosis not present

## 2022-08-02 DIAGNOSIS — H338 Other retinal detachments: Secondary | ICD-10-CM | POA: Diagnosis not present

## 2022-08-02 DIAGNOSIS — H3530 Unspecified macular degeneration: Secondary | ICD-10-CM | POA: Diagnosis not present

## 2022-08-02 DIAGNOSIS — K224 Dyskinesia of esophagus: Secondary | ICD-10-CM | POA: Diagnosis not present

## 2022-08-02 DIAGNOSIS — D490 Neoplasm of unspecified behavior of digestive system: Secondary | ICD-10-CM | POA: Diagnosis not present

## 2022-08-02 DIAGNOSIS — N2889 Other specified disorders of kidney and ureter: Secondary | ICD-10-CM | POA: Diagnosis not present

## 2022-08-03 ENCOUNTER — Ambulatory Visit
Admission: RE | Admit: 2022-08-03 | Discharge: 2022-08-03 | Disposition: A | Discharge status: Home / Self Care | Payer: Medicare Other | Source: Ambulatory Visit | Attending: Internal Medicine | Admitting: Internal Medicine

## 2022-08-03 ENCOUNTER — Other Ambulatory Visit: Payer: Self-pay | Admitting: Internal Medicine

## 2022-08-03 DIAGNOSIS — R1030 Lower abdominal pain, unspecified: Secondary | ICD-10-CM

## 2022-08-03 DIAGNOSIS — R109 Unspecified abdominal pain: Secondary | ICD-10-CM | POA: Diagnosis not present

## 2022-08-03 DIAGNOSIS — I700 Atherosclerosis of aorta: Secondary | ICD-10-CM | POA: Diagnosis not present

## 2022-08-03 MED ORDER — IOPAMIDOL (ISOVUE-300) INJECTION 61%
100.0000 mL | Freq: Once | INTRAVENOUS | Status: AC | PRN
  Administered 2022-08-03: 100 mL via INTRAVENOUS

## 2022-08-08 DIAGNOSIS — G4733 Obstructive sleep apnea (adult) (pediatric): Secondary | ICD-10-CM | POA: Diagnosis not present

## 2022-09-21 DIAGNOSIS — M8588 Other specified disorders of bone density and structure, other site: Secondary | ICD-10-CM | POA: Diagnosis not present

## 2022-09-21 DIAGNOSIS — E349 Endocrine disorder, unspecified: Secondary | ICD-10-CM | POA: Diagnosis not present

## 2022-09-28 DIAGNOSIS — G4733 Obstructive sleep apnea (adult) (pediatric): Secondary | ICD-10-CM | POA: Diagnosis not present

## 2022-10-11 DIAGNOSIS — M461 Sacroiliitis, not elsewhere classified: Secondary | ICD-10-CM | POA: Diagnosis not present

## 2022-11-01 DIAGNOSIS — H35452 Secondary pigmentary degeneration, left eye: Secondary | ICD-10-CM | POA: Diagnosis not present

## 2022-11-01 DIAGNOSIS — H35362 Drusen (degenerative) of macula, left eye: Secondary | ICD-10-CM | POA: Diagnosis not present

## 2022-11-01 DIAGNOSIS — H34831 Tributary (branch) retinal vein occlusion, right eye, with macular edema: Secondary | ICD-10-CM | POA: Diagnosis not present

## 2022-11-01 DIAGNOSIS — Z961 Presence of intraocular lens: Secondary | ICD-10-CM | POA: Diagnosis not present

## 2022-11-01 DIAGNOSIS — H31091 Other chorioretinal scars, right eye: Secondary | ICD-10-CM | POA: Diagnosis not present

## 2022-11-07 DIAGNOSIS — R109 Unspecified abdominal pain: Secondary | ICD-10-CM | POA: Diagnosis not present

## 2022-11-07 DIAGNOSIS — M461 Sacroiliitis, not elsewhere classified: Secondary | ICD-10-CM | POA: Diagnosis not present

## 2023-01-16 DIAGNOSIS — M461 Sacroiliitis, not elsewhere classified: Secondary | ICD-10-CM | POA: Diagnosis not present

## 2023-02-07 DIAGNOSIS — I129 Hypertensive chronic kidney disease with stage 1 through stage 4 chronic kidney disease, or unspecified chronic kidney disease: Secondary | ICD-10-CM | POA: Diagnosis not present

## 2023-02-07 DIAGNOSIS — G4733 Obstructive sleep apnea (adult) (pediatric): Secondary | ICD-10-CM | POA: Diagnosis not present

## 2023-03-12 DIAGNOSIS — H21501 Unspecified adhesions of iris, right eye: Secondary | ICD-10-CM | POA: Diagnosis not present

## 2023-03-12 DIAGNOSIS — H5231 Anisometropia: Secondary | ICD-10-CM | POA: Diagnosis not present

## 2023-03-12 DIAGNOSIS — H538 Other visual disturbances: Secondary | ICD-10-CM | POA: Diagnosis not present

## 2023-03-12 DIAGNOSIS — H02831 Dermatochalasis of right upper eyelid: Secondary | ICD-10-CM | POA: Diagnosis not present

## 2023-03-12 DIAGNOSIS — H04123 Dry eye syndrome of bilateral lacrimal glands: Secondary | ICD-10-CM | POA: Diagnosis not present

## 2023-03-12 DIAGNOSIS — H02834 Dermatochalasis of left upper eyelid: Secondary | ICD-10-CM | POA: Diagnosis not present

## 2023-03-12 DIAGNOSIS — H353111 Nonexudative age-related macular degeneration, right eye, early dry stage: Secondary | ICD-10-CM | POA: Diagnosis not present

## 2023-03-12 DIAGNOSIS — H348112 Central retinal vein occlusion, right eye, stable: Secondary | ICD-10-CM | POA: Diagnosis not present

## 2023-03-12 DIAGNOSIS — H31091 Other chorioretinal scars, right eye: Secondary | ICD-10-CM | POA: Diagnosis not present

## 2023-03-13 DIAGNOSIS — K224 Dyskinesia of esophagus: Secondary | ICD-10-CM | POA: Diagnosis not present

## 2023-03-13 DIAGNOSIS — G4730 Sleep apnea, unspecified: Secondary | ICD-10-CM | POA: Diagnosis not present

## 2023-03-13 DIAGNOSIS — E7800 Pure hypercholesterolemia, unspecified: Secondary | ICD-10-CM | POA: Diagnosis not present

## 2023-03-13 DIAGNOSIS — I129 Hypertensive chronic kidney disease with stage 1 through stage 4 chronic kidney disease, or unspecified chronic kidney disease: Secondary | ICD-10-CM | POA: Diagnosis not present

## 2023-03-13 DIAGNOSIS — G8929 Other chronic pain: Secondary | ICD-10-CM | POA: Diagnosis not present

## 2023-03-13 DIAGNOSIS — E039 Hypothyroidism, unspecified: Secondary | ICD-10-CM | POA: Diagnosis not present

## 2023-03-13 DIAGNOSIS — I2699 Other pulmonary embolism without acute cor pulmonale: Secondary | ICD-10-CM | POA: Diagnosis not present

## 2023-03-13 DIAGNOSIS — N183 Chronic kidney disease, stage 3 unspecified: Secondary | ICD-10-CM | POA: Diagnosis not present

## 2023-03-20 DIAGNOSIS — Z1231 Encounter for screening mammogram for malignant neoplasm of breast: Secondary | ICD-10-CM | POA: Diagnosis not present

## 2023-03-28 DIAGNOSIS — D49511 Neoplasm of unspecified behavior of right kidney: Secondary | ICD-10-CM | POA: Diagnosis not present

## 2023-04-07 ENCOUNTER — Emergency Department (HOSPITAL_BASED_OUTPATIENT_CLINIC_OR_DEPARTMENT_OTHER)
Admission: EM | Admit: 2023-04-07 | Discharge: 2023-04-07 | Disposition: A | Discharge status: Home / Self Care | Payer: Medicare Other | Attending: Emergency Medicine | Admitting: Emergency Medicine

## 2023-04-07 ENCOUNTER — Emergency Department (HOSPITAL_BASED_OUTPATIENT_CLINIC_OR_DEPARTMENT_OTHER): Payer: Medicare Other | Admitting: Radiology

## 2023-04-07 ENCOUNTER — Encounter (HOSPITAL_BASED_OUTPATIENT_CLINIC_OR_DEPARTMENT_OTHER): Payer: Self-pay

## 2023-04-07 ENCOUNTER — Other Ambulatory Visit: Payer: Self-pay

## 2023-04-07 ENCOUNTER — Emergency Department (HOSPITAL_BASED_OUTPATIENT_CLINIC_OR_DEPARTMENT_OTHER): Payer: Medicare Other

## 2023-04-07 DIAGNOSIS — M79604 Pain in right leg: Secondary | ICD-10-CM | POA: Diagnosis present

## 2023-04-07 DIAGNOSIS — M1711 Unilateral primary osteoarthritis, right knee: Secondary | ICD-10-CM | POA: Diagnosis not present

## 2023-04-07 DIAGNOSIS — Z7901 Long term (current) use of anticoagulants: Secondary | ICD-10-CM | POA: Insufficient documentation

## 2023-04-07 DIAGNOSIS — Z79899 Other long term (current) drug therapy: Secondary | ICD-10-CM | POA: Diagnosis not present

## 2023-04-07 DIAGNOSIS — M25571 Pain in right ankle and joints of right foot: Secondary | ICD-10-CM | POA: Diagnosis not present

## 2023-04-07 DIAGNOSIS — M25561 Pain in right knee: Secondary | ICD-10-CM | POA: Diagnosis not present

## 2023-04-07 DIAGNOSIS — M19071 Primary osteoarthritis, right ankle and foot: Secondary | ICD-10-CM | POA: Diagnosis not present

## 2023-04-07 DIAGNOSIS — M7989 Other specified soft tissue disorders: Secondary | ICD-10-CM | POA: Diagnosis not present

## 2023-04-07 MED ORDER — METHYLPREDNISOLONE 4 MG PO TBPK: ORAL_TABLET | ORAL | 0 refills | Status: AC

## 2023-04-07 NOTE — Discharge Instructions (Signed)
 Take Medrol  Dosepak as prescribed.  Follow-up with orthopedics and your primary care doctor.  Return if symptoms worsen.

## 2023-04-07 NOTE — ED Notes (Signed)
 Ace wrap applied to R knee, pt education provided on instruction to wrap, CNS intact after wrapping. Pt and daughter verbalized understanding.

## 2023-04-07 NOTE — ED Provider Notes (Signed)
 [place] EMERGENCY DEPARTMENT AT [facility] Provider Note   CSN: [id number] Arrival date & time: 04/07/23  9040     History  Chief Complaint  Patient presents with   Leg Pain    Cheryl Herman is a 83 y.o. female.  Patient here with right lower leg pain right ankle right knee pain.  History of blood clots.  She is on blood thinners.  Arthritis history.  Pain when she walks.  Uses a walker at baseline.  Denies any trauma.  Denies any fevers or chills.  No redness swelling but a little bit of swelling to the right ankle  The history is provided by the patient.       Home Medications Prior to Admission medications   Medication Sig Start Date End Date Taking? Authorizing Provider  methylPREDNISolone  (MEDROL  DOSEPAK) 4 MG TBPK tablet Follow package insert 04/07/23  Yes Chaselyn Nanney, DO  acetaminophen  (TYLENOL ) 650 MG CR tablet Take 650 mg by mouth every 8 (eight) hours as needed for pain.    [provider]  apixaban  (ELIQUIS ) 5 MG TABS tablet Take 2 tablets (10 mg total) by mouth 2 (two) times daily for 6 days, THEN 1 tablet (5 mg total) 2 (two) times daily for 24 days. 04/27/22 05/27/22  Mapp, Tavien, MD  atorvastatin (LIPITOR) 10 MG tablet Take 1 tablet by mouth every other day.    [provider]  B Complex-C (B-COMPLEX WITH VITAMIN C) tablet Take 1 tablet by mouth daily.    [provider]  cholecalciferol (VITAMIN D) 1000 UNITS tablet Take 1,000 Units by mouth daily.    [provider]  hydrochlorothiazide  (HYDRODIURIL ) 25 MG tablet Take 25 mg by mouth daily.    [provider]  levothyroxine  (SYNTHROID ) 75 MCG tablet Take 75 mcg by mouth daily before breakfast.    [provider]  MAGNESIUM PO Take 2 tablets by mouth at bedtime.    [provider]  metoprolol  succinate (TOPROL -XL) 50 MG 24 hr tablet TAKE AS DIRECTED ONCE DAILY 90 DAYS 06/19/19   [provider]  Misc Natural Products (OSTEO  BI-FLEX JOINT SHIELD PO) Take 2 tablets by mouth daily.    [provider]  Multiple Vitamins-Minerals (CENTRUM SILVER 50+WOMEN PO) Take 1 tablet by mouth daily.    [provider]  NIFEdipine (ADALAT CC) 30 MG 24 hr tablet Take 30 mg by mouth daily.    [provider]  pantoprazole  (PROTONIX ) 40 MG tablet Take 1 tablet (40 mg total) by mouth 2 (two) times daily. 04/27/22   Mapp, Tavien, MD  POTASSIUM PO Take 1 tablet by mouth at bedtime.    [provider]  sodium chloride  (OCEAN) 0.65 % SOLN nasal spray Place 1 spray into both nostrils as needed for congestion.    [provider]      Allergies    Patient has no known allergies.    Review of Systems   Review of Systems  Physical Exam Updated Vital Signs  ED Triage Vitals  Encounter Vitals Group     BP 04/07/23 1016 (!) 147/75     Systolic BP Percentile --      Diastolic BP Percentile --      Pulse Rate 04/07/23 1016 72     Resp 04/07/23 1016 14     Temp 04/07/23 1016 98.4 F (36.9 C)     Temp Source 04/07/23 1016 Oral     SpO2 04/07/23 1016 99 %  Weight --      Height --      Head Circumference --      Peak Flow --      Pain Score 04/07/23 1017 4     Pain Loc --      Pain Education --      Exclude from Growth Chart --      Physical Exam Vitals and nursing note reviewed.  Constitutional:      General: She is not in acute distress.    Appearance: She is well-developed. She is not ill-appearing.  HENT:     Head: Normocephalic and atraumatic.     Nose: Nose normal.     Mouth/Throat:     Mouth: Mucous membranes are moist.  Eyes:     Extraocular Movements: Extraocular movements intact.     Conjunctiva/sclera: Conjunctivae normal.     Pupils: Pupils are equal, round, and reactive to light.  Cardiovascular:     Rate and Rhythm: Normal rate and regular rhythm.     Pulses: Normal pulses.     Heart sounds: Normal heart sounds. No murmur heard. Pulmonary:     Effort:  Pulmonary effort is normal. No respiratory distress.     Breath sounds: Normal breath sounds.  Abdominal:     Palpations: Abdomen is soft.     Tenderness: There is no abdominal tenderness.  Musculoskeletal:        General: Tenderness present. No swelling.     Cervical back: Normal range of motion and neck supple.     Comments: Tenderness to the right ankle right knee with no major swelling normal range of motion without much discomfort, no erythema or warmth  Skin:    General: Skin is warm and dry.     Capillary Refill: Capillary refill takes less than 2 seconds.  Neurological:     General: No focal deficit present.     Mental Status: She is alert.     Sensory: No sensory deficit.     Motor: No weakness.  Psychiatric:        Mood and Affect: Mood normal.     ED Results / Procedures / Treatments   Labs (all labs ordered are listed, but only abnormal results are displayed) Labs Reviewed - No data to display  EKG None  Radiology US  Venous Img Lower Right (DVT Study) Result Date: 04/07/2023 CLINICAL DATA:  Right knee pain with standing EXAM: RIGHT LOWER EXTREMITY VENOUS DOPPLER ULTRASOUND TECHNIQUE: Gray-scale sonography with compression, as well as color and duplex ultrasound, were performed to evaluate the deep venous system(s) from the level of the common femoral vein through the popliteal and proximal calf veins. COMPARISON:  None Available. FINDINGS: VENOUS Normal compressibility of the common femoral, superficial femoral, and popliteal veins, as well as the visualized calf veins. Visualized portions of profunda femoral vein and great saphenous vein unremarkable. No filling defects to suggest DVT on grayscale or color Doppler imaging. Doppler waveforms show normal direction of venous flow, normal respiratory plasticity and response to augmentation. Limited views of the contralateral common femoral vein are unremarkable. OTHER None. Limitations: none IMPRESSION: No evidence of deep  venous thrombosis in the right lower extremity. Electronically Signed   By: Selinda DELENA Blue M.D.   On: 04/07/2023 12:27   DG Ankle Complete Right Result Date: 04/07/2023 CLINICAL DATA:  Unable to walk this morning EXAM: RIGHT ANKLE - COMPLETE 3+ VIEW; RIGHT KNEE - COMPLETE 4+ VIEW COMPARISON:  None Available. FINDINGS: No acute fracture or  dislocation in the right knee. Mild arthritis with mild medial compartment narrowing. No knee joint effusion. No acute fracture or dislocation of the right ankle. Mild swelling about the ankle. Or IMPRESSION: 1. No acute fracture or dislocation in the right knee or ankle. 2. Mild swelling about the ankle. Electronically Signed   By: Norman Gatlin M.D.   On: 04/07/2023 11:51   DG Knee Complete 4 Views Right Result Date: 04/07/2023 CLINICAL DATA:  Unable to walk this morning EXAM: RIGHT ANKLE - COMPLETE 3+ VIEW; RIGHT KNEE - COMPLETE 4+ VIEW COMPARISON:  None Available. FINDINGS: No acute fracture or dislocation in the right knee. Mild arthritis with mild medial compartment narrowing. No knee joint effusion. No acute fracture or dislocation of the right ankle. Mild swelling about the ankle. Or IMPRESSION: 1. No acute fracture or dislocation in the right knee or ankle. 2. Mild swelling about the ankle. Electronically Signed   By: Norman Gatlin M.D.   On: 04/07/2023 11:51    Procedures Procedures    Medications Ordered in ED Medications - No data to display  ED Course/ Medical Decision Making/ A&P                                 Medical Decision Making Amount and/or Complexity of Data Reviewed Radiology: ordered.   Cheryl Herman is here with right lower leg pain.  History of blood clots.  On blood thinners.  Frontal diagnosis likely arthritis process but will evaluate with x-rays DVT study to evaluate for fracture or malalignment DVT.  I do think that this is inflammatory.  I have no concern for septic joint or cellulitic process otherwise.  Patient  neurovascular neuromuscular intact.  X-rays of the knee and ankle per radiology port unremarkable.  DVT study is negative.  Overall Ace wrap placed to the right knee.  Will place on steroids for few days to help with inflammation and have her follow-up with orthopedics.  Discharged in good condition.  This chart was dictated using voice recognition software.  Despite best efforts to proofread,  errors can occur which can change the documentation meaning.         Final Clinical Impression(s) / ED Diagnoses Final diagnoses:  Acute pain of right knee  Arthralgia of right ankle    Rx / DC Orders ED Discharge Orders          Ordered    methylPREDNISolone  (MEDROL  DOSEPAK) 4 MG TBPK tablet        04/07/23 1251              CuratoloJuliene, DO 04/07/23 1252

## 2023-04-07 NOTE — ED Triage Notes (Signed)
 Patient presents to the ED with c/o R lower leg pain that started one week ago. Denies injury or trauma. Pulses and sensation intact.

## 2023-04-12 DIAGNOSIS — M25561 Pain in right knee: Secondary | ICD-10-CM | POA: Diagnosis not present

## 2023-04-30 DIAGNOSIS — M461 Sacroiliitis, not elsewhere classified: Secondary | ICD-10-CM | POA: Diagnosis not present

## 2023-05-09 DIAGNOSIS — H35362 Drusen (degenerative) of macula, left eye: Secondary | ICD-10-CM | POA: Diagnosis not present

## 2023-05-09 DIAGNOSIS — H34831 Tributary (branch) retinal vein occlusion, right eye, with macular edema: Secondary | ICD-10-CM | POA: Diagnosis not present

## 2023-05-09 DIAGNOSIS — H59811 Chorioretinal scars after surgery for detachment, right eye: Secondary | ICD-10-CM | POA: Diagnosis not present

## 2023-05-16 DIAGNOSIS — G4733 Obstructive sleep apnea (adult) (pediatric): Secondary | ICD-10-CM | POA: Diagnosis not present

## 2023-08-21 DIAGNOSIS — I2699 Other pulmonary embolism without acute cor pulmonale: Secondary | ICD-10-CM | POA: Diagnosis not present

## 2023-08-21 DIAGNOSIS — N2889 Other specified disorders of kidney and ureter: Secondary | ICD-10-CM | POA: Diagnosis not present

## 2023-08-21 DIAGNOSIS — Z0000 Encounter for general adult medical examination without abnormal findings: Secondary | ICD-10-CM | POA: Diagnosis not present

## 2023-08-21 DIAGNOSIS — K219 Gastro-esophageal reflux disease without esophagitis: Secondary | ICD-10-CM | POA: Diagnosis not present

## 2023-08-21 DIAGNOSIS — D490 Neoplasm of unspecified behavior of digestive system: Secondary | ICD-10-CM | POA: Diagnosis not present

## 2023-08-21 DIAGNOSIS — E7800 Pure hypercholesterolemia, unspecified: Secondary | ICD-10-CM | POA: Diagnosis not present

## 2023-08-21 DIAGNOSIS — E039 Hypothyroidism, unspecified: Secondary | ICD-10-CM | POA: Diagnosis not present

## 2023-08-21 DIAGNOSIS — K224 Dyskinesia of esophagus: Secondary | ICD-10-CM | POA: Diagnosis not present

## 2023-08-21 DIAGNOSIS — N183 Chronic kidney disease, stage 3 unspecified: Secondary | ICD-10-CM | POA: Diagnosis not present

## 2023-08-21 DIAGNOSIS — G8929 Other chronic pain: Secondary | ICD-10-CM | POA: Diagnosis not present

## 2023-08-21 DIAGNOSIS — J309 Allergic rhinitis, unspecified: Secondary | ICD-10-CM | POA: Diagnosis not present

## 2023-08-21 DIAGNOSIS — I129 Hypertensive chronic kidney disease with stage 1 through stage 4 chronic kidney disease, or unspecified chronic kidney disease: Secondary | ICD-10-CM | POA: Diagnosis not present

## 2023-08-23 DIAGNOSIS — M461 Sacroiliitis, not elsewhere classified: Secondary | ICD-10-CM | POA: Diagnosis not present

## 2023-08-27 DIAGNOSIS — M1990 Unspecified osteoarthritis, unspecified site: Secondary | ICD-10-CM | POA: Diagnosis not present

## 2023-08-27 DIAGNOSIS — E039 Hypothyroidism, unspecified: Secondary | ICD-10-CM | POA: Diagnosis not present

## 2023-08-27 DIAGNOSIS — N183 Chronic kidney disease, stage 3 unspecified: Secondary | ICD-10-CM | POA: Diagnosis not present

## 2023-09-10 DIAGNOSIS — H5231 Anisometropia: Secondary | ICD-10-CM | POA: Diagnosis not present

## 2023-09-10 DIAGNOSIS — H21501 Unspecified adhesions of iris, right eye: Secondary | ICD-10-CM | POA: Diagnosis not present

## 2023-09-10 DIAGNOSIS — H538 Other visual disturbances: Secondary | ICD-10-CM | POA: Diagnosis not present

## 2023-09-10 DIAGNOSIS — H31091 Other chorioretinal scars, right eye: Secondary | ICD-10-CM | POA: Diagnosis not present

## 2023-09-10 DIAGNOSIS — H348112 Central retinal vein occlusion, right eye, stable: Secondary | ICD-10-CM | POA: Diagnosis not present

## 2023-09-10 DIAGNOSIS — H04123 Dry eye syndrome of bilateral lacrimal glands: Secondary | ICD-10-CM | POA: Diagnosis not present

## 2023-10-08 DIAGNOSIS — C44619 Basal cell carcinoma of skin of left upper limb, including shoulder: Secondary | ICD-10-CM | POA: Diagnosis not present

## 2023-10-08 DIAGNOSIS — D044 Carcinoma in situ of skin of scalp and neck: Secondary | ICD-10-CM | POA: Diagnosis not present

## 2023-10-16 DIAGNOSIS — C44619 Basal cell carcinoma of skin of left upper limb, including shoulder: Secondary | ICD-10-CM | POA: Diagnosis not present

## 2023-10-19 DIAGNOSIS — K921 Melena: Secondary | ICD-10-CM | POA: Diagnosis not present

## 2023-10-19 DIAGNOSIS — I2699 Other pulmonary embolism without acute cor pulmonale: Secondary | ICD-10-CM | POA: Diagnosis not present

## 2023-10-31 DIAGNOSIS — K921 Melena: Secondary | ICD-10-CM | POA: Diagnosis not present

## 2023-11-09 DIAGNOSIS — H35362 Drusen (degenerative) of macula, left eye: Secondary | ICD-10-CM | POA: Diagnosis not present

## 2023-11-09 DIAGNOSIS — H34831 Tributary (branch) retinal vein occlusion, right eye, with macular edema: Secondary | ICD-10-CM | POA: Diagnosis not present

## 2023-11-09 DIAGNOSIS — H59813 Chorioretinal scars after surgery for detachment, bilateral: Secondary | ICD-10-CM | POA: Diagnosis not present

## 2023-11-26 DIAGNOSIS — M461 Sacroiliitis, not elsewhere classified: Secondary | ICD-10-CM | POA: Diagnosis not present
